# Patient Record
Sex: Male | Born: 1960
Health system: Southern US, Community
[De-identification: ages and names within clinical notes are randomized; demographics above are authoritative.]

## PROBLEM LIST (undated history)

## (undated) DIAGNOSIS — I10 Essential (primary) hypertension: Secondary | ICD-10-CM

## (undated) DIAGNOSIS — K219 Gastro-esophageal reflux disease without esophagitis: Secondary | ICD-10-CM

## (undated) DIAGNOSIS — G4730 Sleep apnea, unspecified: Secondary | ICD-10-CM

## (undated) HISTORY — DX: Sleep apnea, unspecified: G47.30

## (undated) HISTORY — PX: ESOPHAGOGASTRODUODENOSCOPY: SHX1529

## (undated) HISTORY — PX: UMBILICAL HERNIA REPAIR: SHX196

## (undated) HISTORY — PX: CHOLECYSTECTOMY: SHX55

---

## 2001-12-30 ENCOUNTER — Ambulatory Visit (HOSPITAL_BASED_OUTPATIENT_CLINIC_OR_DEPARTMENT_OTHER): Admission: RE | Admit: 2001-12-30 | Discharge: 2001-12-30 | Payer: Self-pay | Admitting: Internal Medicine

## 2004-05-20 ENCOUNTER — Emergency Department (HOSPITAL_COMMUNITY): Admission: EM | Admit: 2004-05-20 | Discharge: 2004-05-20 | Payer: Self-pay | Admitting: Emergency Medicine

## 2004-11-05 ENCOUNTER — Emergency Department (HOSPITAL_COMMUNITY): Admission: EM | Admit: 2004-11-05 | Discharge: 2004-11-05 | Payer: Self-pay | Admitting: Emergency Medicine

## 2007-03-23 ENCOUNTER — Emergency Department (HOSPITAL_COMMUNITY): Admission: EM | Admit: 2007-03-23 | Discharge: 2007-03-23 | Payer: Self-pay | Admitting: Emergency Medicine

## 2014-10-19 ENCOUNTER — Encounter (HOSPITAL_COMMUNITY): Payer: Self-pay | Admitting: Emergency Medicine

## 2014-10-19 ENCOUNTER — Emergency Department (HOSPITAL_COMMUNITY): Payer: BLUE CROSS/BLUE SHIELD

## 2014-10-19 ENCOUNTER — Emergency Department (HOSPITAL_COMMUNITY)
Admission: EM | Admit: 2014-10-19 | Discharge: 2014-10-19 | Disposition: A | Discharge status: Home / Self Care | Payer: BLUE CROSS/BLUE SHIELD | Attending: Emergency Medicine | Admitting: Emergency Medicine

## 2014-10-19 DIAGNOSIS — I10 Essential (primary) hypertension: Secondary | ICD-10-CM | POA: Insufficient documentation

## 2014-10-19 DIAGNOSIS — R079 Chest pain, unspecified: Secondary | ICD-10-CM

## 2014-10-19 DIAGNOSIS — R0789 Other chest pain: Secondary | ICD-10-CM | POA: Insufficient documentation

## 2014-10-19 DIAGNOSIS — K219 Gastro-esophageal reflux disease without esophagitis: Secondary | ICD-10-CM | POA: Diagnosis not present

## 2014-10-19 DIAGNOSIS — R1013 Epigastric pain: Secondary | ICD-10-CM | POA: Diagnosis present

## 2014-10-19 DIAGNOSIS — Z79899 Other long term (current) drug therapy: Secondary | ICD-10-CM | POA: Diagnosis not present

## 2014-10-19 DIAGNOSIS — R109 Unspecified abdominal pain: Secondary | ICD-10-CM

## 2014-10-19 HISTORY — DX: Essential (primary) hypertension: I10

## 2014-10-19 HISTORY — DX: Gastro-esophageal reflux disease without esophagitis: K21.9

## 2014-10-19 LAB — LIPASE, BLOOD: Lipase: 24 U/L (ref 22–51)

## 2014-10-19 LAB — CBC WITH DIFFERENTIAL/PLATELET
BASOS ABS: 0 10*3/uL (ref 0.0–0.1)
BASOS PCT: 0 % (ref 0–1)
Eosinophils Absolute: 0 10*3/uL (ref 0.0–0.7)
Eosinophils Relative: 0 % (ref 0–5)
HEMATOCRIT: 41.1 % (ref 39.0–52.0)
HEMOGLOBIN: 14.2 g/dL (ref 13.0–17.0)
LYMPHS PCT: 12 % (ref 12–46)
Lymphs Abs: 1.8 10*3/uL (ref 0.7–4.0)
MCH: 28.6 pg (ref 26.0–34.0)
MCHC: 34.5 g/dL (ref 30.0–36.0)
MCV: 82.7 fL (ref 78.0–100.0)
Monocytes Absolute: 0.7 10*3/uL (ref 0.1–1.0)
Monocytes Relative: 5 % (ref 3–12)
NEUTROS ABS: 12.2 10*3/uL — AB (ref 1.7–7.7)
NEUTROS PCT: 83 % — AB (ref 43–77)
Platelets: 230 10*3/uL (ref 150–400)
RBC: 4.97 MIL/uL (ref 4.22–5.81)
RDW: 12.7 % (ref 11.5–15.5)
WBC: 14.7 10*3/uL — AB (ref 4.0–10.5)

## 2014-10-19 LAB — COMPREHENSIVE METABOLIC PANEL
ALT: 32 U/L (ref 17–63)
AST: 25 U/L (ref 15–41)
Albumin: 4.2 g/dL (ref 3.5–5.0)
Alkaline Phosphatase: 88 U/L (ref 38–126)
Anion gap: 10 (ref 5–15)
BUN: 15 mg/dL (ref 6–20)
CALCIUM: 9 mg/dL (ref 8.9–10.3)
CO2: 25 mmol/L (ref 22–32)
CREATININE: 0.85 mg/dL (ref 0.61–1.24)
Chloride: 101 mmol/L (ref 101–111)
GFR calc non Af Amer: >60 mL/min (ref >60)
GLUCOSE: 114 mg/dL — AB (ref 65–99)
Potassium: 3.3 mmol/L — ABNORMAL LOW (ref 3.5–5.1)
SODIUM: 136 mmol/L (ref 135–145)
TOTAL PROTEIN: 7.4 g/dL (ref 6.5–8.1)
Total Bilirubin: 0.5 mg/dL (ref 0.3–1.2)

## 2014-10-19 LAB — TROPONIN I: Troponin I: <0.03 ng/mL (ref <0.031)

## 2014-10-19 LAB — D-DIMER, QUANTITATIVE: D-Dimer, Quant: <0.27 ug/mL-FEU (ref 0.00–0.48)

## 2014-10-19 MED ORDER — IOHEXOL 300 MG/ML  SOLN
100.0000 mL | Freq: Once | INTRAMUSCULAR | Status: AC | PRN
  Administered 2014-10-19: 100 mL via INTRAVENOUS

## 2014-10-19 MED ORDER — GI COCKTAIL ~~LOC~~
ORAL | Status: AC
  Filled 2014-10-19: qty 30

## 2014-10-19 MED ORDER — HYDROCODONE-ACETAMINOPHEN 5-325 MG PO TABS: 2.0000 | ORAL_TABLET | ORAL | Status: DC | PRN

## 2014-10-19 MED ORDER — IOHEXOL 300 MG/ML  SOLN
25.0000 mL | Freq: Once | INTRAMUSCULAR | Status: AC | PRN
  Administered 2014-10-19: 25 mL via ORAL

## 2014-10-19 MED ORDER — FAMOTIDINE IN NACL 20-0.9 MG/50ML-% IV SOLN
20.0000 mg | INTRAVENOUS | Status: AC
  Administered 2014-10-19: 20 mg via INTRAVENOUS
  Filled 2014-10-19: qty 50

## 2014-10-19 MED ORDER — GI COCKTAIL ~~LOC~~
30.0000 mL | Freq: Once | ORAL | Status: DC
Start: 2014-10-19 — End: 2014-10-19

## 2014-10-19 MED ORDER — RANITIDINE HCL 150 MG PO CAPS: 150.0000 mg | ORAL_CAPSULE | Freq: Two times a day (BID) | ORAL | Status: DC

## 2014-10-19 MED ORDER — PANTOPRAZOLE SODIUM 40 MG IV SOLR
40.0000 mg | INTRAVENOUS | Status: AC
  Administered 2014-10-19: 40 mg via INTRAVENOUS
  Filled 2014-10-19: qty 40

## 2014-10-19 MED ORDER — GI COCKTAIL ~~LOC~~
30.0000 mL | Freq: Once | ORAL | Status: AC
  Administered 2014-10-19: 30 mL via ORAL

## 2014-10-19 NOTE — Discharge Instructions (Signed)
Your testing has shown an elevated blood count your other tests and your CT scan were normal -   Read attached reading instructions about peptic ulcer disease.  Please call your doctor for a followup appointment within 24-48 hours. When you talk to your doctor please let them know that you were seen in the emergency department and have them acquire all of your records so that they can discuss the findings with you and formulate a treatment plan to fully care for your new and ongoing problems.

## 2014-10-19 NOTE — ED Notes (Signed)
PT c/o epigastric pain that radiates to his back starting this am at 0700. PT also c/o SOB on exertion today.

## 2014-10-19 NOTE — ED Notes (Signed)
Pt transported to CT ?

## 2014-10-19 NOTE — ED Provider Notes (Signed)
CSN: [id number]     Arrival date & time 10/19/14  1448 History   First MD Initiated Contact with Patient 10/19/14 1501     Chief Complaint  Patient presents with  . Abdominal Pain     (Consider location/radiation/quality/duration/timing/severity/associated sxs/prior Treatment) HPI Comments: The pt is a 54 y/o male, hx of htn, within 1 hour of eating breakfast consisting of 2 sausage biscuits - he developed acute onset of pain in the back and the epigastrium - this has been constant, seems to be positional slightly worse with laying back - not associated with cough, fevers, vomiting, diarrhea, swelling or headaches.  He has no hx of heart disease, lung disease or vascular disease.  He feels that this is somewhat similar to his GERD in the past except for the back pain which is new.  No meds pta.  Patient is a 54 y.o. male presenting with abdominal pain. The history is provided by the patient.  Abdominal Pain   Past Medical History  Diagnosis Date  . Hypertension   . GERD (gastroesophageal reflux disease)    History reviewed. No pertinent past surgical history. History reviewed. No pertinent family history. History  Substance Use Topics  . Smoking status: Never Smoker   . Smokeless tobacco: Not on file  . Alcohol Use: No    Review of Systems  Gastrointestinal: Positive for abdominal pain.  All other systems reviewed and are negative.     Allergies  Review of patient's allergies indicates no known allergies.  Home Medications   Prior to Admission medications   Medication Sig Start Date End Date Taking? Authorizing Provider  amLODipine (NORVASC) 10 MG tablet Take 10 mg by mouth daily. 07/24/14  Yes Historical Provider, MD  lisinopril-hydrochlorothiazide (PRINZIDE,ZESTORETIC) 20-12.5 MG per tablet Take 2 tablets by mouth daily. 10/01/14  Yes Historical Provider, MD  zolpidem (AMBIEN) 10 MG tablet Take 10 mg by mouth at bedtime. 08/05/14  Yes Historical Provider, MD   HYDROcodone-acetaminophen (NORCO/VICODIN) 5-325 MG per tablet Take 2 tablets by mouth every 4 (four) hours as needed. 10/19/14   Eber HongBrian Jannatul Wojdyla, MD  ranitidine (ZANTAC) 150 MG capsule Take 1 capsule (150 mg total) by mouth 2 (two) times daily. 10/19/14   Eber HongBrian Tavi Hoogendoorn, MD   BP 145/85 mmHg  Pulse 103  Temp(Src) 97.8 F (36.6 C) (Oral)  Resp 20  Ht 5\' 4"  (1.626 m)  Wt 260 lb (117.935 kg)  BMI 44.61 kg/m2  SpO2 100% Physical Exam  Constitutional: He appears well-developed and well-nourished. No distress.  HENT:  Normocephalic, atraumatic, clear oropharynx without any swelling, exudate, asymmetry, hypertrophy or erythema.  Moist mucous membranes, clear nasal passages  Eyes:  Bilateral conjunctiva are clear, pupils are equal round and reactive to light, there is no asymmetry of the pupils. There is no conjunctival discharge, there is no scleral icterus, the extraocular movements are normal. There is no periorbital swelling or edema or erythema.  Neck:  Very supple neck, no lymphadenopathy, no stiffness, no meningismus. No thyromegaly, trachea is midline. There is no torticollis  Cardiovascular:  The patient has normal heart sounds, no murmurs rubs or gallops,  t mildachycardia, strong pulses at the radial arteries bilaterally, no JVD, normal capillary refill time less than 2 seconds.  Pulmonary/Chest:  Lungs sounds are clear to auscultation bilaterally, no wheezes rhonchi or rales, no increased work of breathing, speaks in full sentences, no cyanosis, no accessory muscle use.  Abdominal:  The abdomen is soft and minimally tender in the epigastrium, no other  abdominal tenderness, there is no tenderness in the right lower quadrant, right upper quadrant, there is no CVA tenderness, there is no guarding, no masses. There is normal bowel sounds. There are no peritoneal signs, the abdomen is nonsurgical  Musculoskeletal:  Bilateral arms and legs are supple without deformity or tenderness. Normal range  of motion of all the major joints, no asymmetry of the lower extremities, no edema  Neurological:  No truncal or limb ataxia, normal gait   Skin: He is not diaphoretic.    ED Course  Procedures (including critical care time) Labs Review Labs Reviewed  CBC WITH DIFFERENTIAL/PLATELET - Abnormal; Notable for the following:    WBC 14.7 (*)    Neutrophils Relative % 83 (*)    Neutro Abs 12.2 (*)    All other components within normal limits  COMPREHENSIVE METABOLIC PANEL - Abnormal; Notable for the following:    Potassium 3.3 (*)    Glucose, Bld 114 (*)    All other components within normal limits  TROPONIN I  LIPASE, BLOOD  D-DIMER, QUANTITATIVE    Imaging Review Dg Chest 2 View  10/19/2014   CLINICAL DATA:  Chest pain and epigastric pain and shortness of breath on exertion.  EXAM: CHEST  2 VIEW  COMPARISON:  11/05/2004  FINDINGS: The heart size and mediastinal contours are within normal limits. Both lungs are clear. The visualized skeletal structures are unremarkable.  IMPRESSION: Normal chest.   Electronically Signed   By: Francene Boyers M.D.   On: 10/19/2014 15:48   Ct Abdomen Pelvis W Contrast  10/19/2014   CLINICAL DATA:  Abdominal and epigastric pain radiating around to back beginning at 0700 hours today, history hypertension, GERD  EXAM: CT ABDOMEN AND PELVIS WITH CONTRAST  TECHNIQUE: Multidetector CT imaging of the abdomen and pelvis was performed using the standard protocol following bolus administration of intravenous contrast. Sagittal and coronal MPR images reconstructed from axial data set.  CONTRAST:  25mL OMNIPAQUE IOHEXOL 300 MG/ML SOLN P.O., OMNIPAQUE IOHEXOL 300 MG/ML SOLN IV  COMPARISON:  None ; prior exam on the time line from 04/21/2011 has no images for comparison  FINDINGS: Lung bases clear.  Faintly visualized gallstones in gallbladder.  Liver, spleen, pancreas, kidneys, and adrenal glands normal.  Umbilical hernia containing fat.  Normal appendix.  Unremarkable  bladder and ureters.  Sigmoid diverticulosis without evidence of diverticulitis.  Stomach and bowel loops otherwise normal appearance.  Question small LEFT inguinal hernia containing fat.  No mass, adenopathy, free air, free fluid, or inflammatory process.  No acute osseous findings.  IMPRESSION: Cholelithiasis.  Umbilical and questionable LEFT inguinal hernias containing fat.  Sigmoid diverticulosis.   Electronically Signed   By: Ulyses Southward M.D.   On: 10/19/2014 16:57     EKG Interpretation   Date/Time:  Tuesday Oct 19 2014 15:07:25 EDT Ventricular Rate:  99 PR Interval:  157 QRS Duration: 97 QT Interval:  344 QTC Calculation: 441 R Axis:   133 Text Interpretation:  Sinus or ectopic atrial rhythm Probable left atrial  enlargement Right axis deviation Abnormal lateral Q waves No old tracing  to compare Confirmed by Tremont Gavitt  MD, Arville Postlewaite ([id number]) on 10/19/2014 3:34:16 PM      MDM   Final diagnoses:  Chest pain  Abdominal pain  Abdominal pain    Overall the patient is well-appearing, his vital signs are significant only for mild hypertension, he has minimally reproducible symptoms on palpation raising concern for other sources of the patient's pain, consider  aneurysm, dissection, cardiac source, pulmonary embolism. Labs ordered, GI cocktail.  Labs with WBC, other labs neg including d dimer - he hqad improvement with pepcid and protonix, not much help with GI cocktail - CT ordered showing no acute findings - pt informed of all results and will need close f/u.   Pt in agreement with plan.  Meds given in ED:  Medications  gi cocktail (Maalox,Lidocaine,Donnatal) (30 mLs Oral Given 10/19/14 1514)  famotidine (PEPCID) IVPB 20 mg premix (0 mg Intravenous Stopped 10/19/14 1728)  pantoprazole (PROTONIX) injection 40 mg (40 mg Intravenous Given 10/19/14 1651)  iohexol (OMNIPAQUE) 300 MG/ML solution 25 mL (25 mLs Oral Contrast Given 10/19/14 1635)  iohexol (OMNIPAQUE) 300 MG/ML solution 100 mL (100  mLs Intravenous Contrast Given 10/19/14 1635)    Discharge Medication List as of 10/19/2014  5:24 PM    START taking these medications   Details  HYDROcodone-acetaminophen (NORCO/VICODIN) 5-325 MG per tablet Take 2 tablets by mouth every 4 (four) hours as needed., Starting 10/19/2014, Until Discontinued, Print    ranitidine (ZANTAC) 150 MG capsule Take 1 capsule (150 mg total) by mouth 2 (two) times daily., Starting 10/19/2014, Until Discontinued, Print            Eber HongBrian Gunter Conde, MD 10/20/14 [phone]

## 2014-10-19 NOTE — ED Notes (Signed)
Duplicate order for GI cocktail, second order discontinued.

## 2015-06-28 ENCOUNTER — Emergency Department (HOSPITAL_COMMUNITY): Payer: BLUE CROSS/BLUE SHIELD

## 2015-06-28 ENCOUNTER — Emergency Department (HOSPITAL_COMMUNITY)
Admission: EM | Admit: 2015-06-28 | Discharge: 2015-06-28 | Disposition: A | Discharge status: Home / Self Care | Payer: BLUE CROSS/BLUE SHIELD | Attending: Emergency Medicine | Admitting: Emergency Medicine

## 2015-06-28 ENCOUNTER — Encounter (HOSPITAL_COMMUNITY): Payer: Self-pay | Admitting: *Deleted

## 2015-06-28 DIAGNOSIS — K219 Gastro-esophageal reflux disease without esophagitis: Secondary | ICD-10-CM | POA: Diagnosis not present

## 2015-06-28 DIAGNOSIS — Z79899 Other long term (current) drug therapy: Secondary | ICD-10-CM | POA: Diagnosis not present

## 2015-06-28 DIAGNOSIS — I10 Essential (primary) hypertension: Secondary | ICD-10-CM | POA: Insufficient documentation

## 2015-06-28 DIAGNOSIS — M19071 Primary osteoarthritis, right ankle and foot: Secondary | ICD-10-CM | POA: Diagnosis not present

## 2015-06-28 DIAGNOSIS — M79671 Pain in right foot: Secondary | ICD-10-CM | POA: Diagnosis present

## 2015-06-28 MED ORDER — TRAMADOL HCL 50 MG PO TABS: 50.0000 mg | ORAL_TABLET | Freq: Four times a day (QID) | ORAL | Status: DC | PRN

## 2015-06-28 MED ORDER — INDOMETHACIN 25 MG PO CAPS
25.0000 mg | ORAL_CAPSULE | Freq: Once | ORAL | Status: AC
  Administered 2015-06-28: 25 mg via ORAL
  Filled 2015-06-28: qty 1

## 2015-06-28 MED ORDER — PREDNISONE 50 MG PO TABS
60.0000 mg | ORAL_TABLET | Freq: Once | ORAL | Status: AC
  Administered 2015-06-28: 60 mg via ORAL
  Filled 2015-06-28: qty 1

## 2015-06-28 MED ORDER — DEXAMETHASONE 4 MG PO TABS: 4.0000 mg | ORAL_TABLET | Freq: Two times a day (BID) | ORAL | Status: DC

## 2015-06-28 MED ORDER — ONDANSETRON HCL 4 MG PO TABS
4.0000 mg | ORAL_TABLET | Freq: Once | ORAL | Status: AC
  Administered 2015-06-28: 4 mg via ORAL
  Filled 2015-06-28: qty 1

## 2015-06-28 MED ORDER — INDOMETHACIN 25 MG PO CAPS: 25.0000 mg | ORAL_CAPSULE | Freq: Three times a day (TID) | ORAL | Status: DC | PRN

## 2015-06-28 NOTE — Discharge Instructions (Signed)
Please see your primary physician, or Dr. Luiz Blare (orthopedic specialist) for additional evaluation and management of your foot pain. Please use the postoperative shoe until you can safely and comfortably you should've regular shoe. Please take the Indocin and the Decadron and the Ultram with food. Ultram may cause drowsiness, please use this medication with caution. Osteoarthritis Osteoarthritis is a disease that causes soreness and inflammation of a joint. It occurs when the cartilage at the affected joint wears down. Cartilage acts as a cushion, covering the ends of bones where they meet to form a joint. Osteoarthritis is the most common form of arthritis. It often occurs in older people. The joints affected most often by this condition include those in the:  Ends of the fingers.  Thumbs.  Neck.  Lower back.  Knees.  Hips. CAUSES  Over time, the cartilage that covers the ends of bones begins to wear away. This causes bone to rub on bone, producing pain and stiffness in the affected joints.  RISK FACTORS Certain factors can increase your chances of having osteoarthritis, including:  Older age.  Excessive body weight.  Overuse of joints.  Previous joint injury. SIGNS AND SYMPTOMS   Pain, swelling, and stiffness in the joint.  Over time, the joint may lose its normal shape.  Small deposits of bone (osteophytes) may grow on the edges of the joint.  Bits of bone or cartilage can break off and float inside the joint space. This may cause more pain and damage. DIAGNOSIS  Your health care provider will do a physical exam and ask about your symptoms. Various tests may be ordered, such as:  X-rays of the affected joint.  Blood tests to rule out other types of arthritis. Additional tests may be used to diagnose your condition. TREATMENT  Goals of treatment are to control pain and improve joint function. Treatment plans may include:  A prescribed exercise program that allows for  rest and joint relief.  A weight control plan.  Pain relief techniques, such as:  Properly applied heat and cold.  Electric pulses delivered to nerve endings under the skin (transcutaneous electrical nerve stimulation [TENS]).  Massage.  Certain nutritional supplements.  Medicines to control pain, such as:  Acetaminophen.  Nonsteroidal anti-inflammatory drugs (NSAIDs), such as naproxen.  Narcotic or central-acting agents, such as tramadol.  Corticosteroids. These can be given orally or as an injection.  Surgery to reposition the bones and relieve pain (osteotomy) or to remove loose pieces of bone and cartilage. Joint replacement may be needed in advanced states of osteoarthritis. HOME CARE INSTRUCTIONS   Take medicines only as directed by your health care provider.  Maintain a healthy weight. Follow your health care provider's instructions for weight control. This may include dietary instructions.  Exercise as directed. Your health care provider can recommend specific types of exercise. These may include:  Strengthening exercises. These are done to strengthen the muscles that support joints affected by arthritis. They can be performed with weights or with exercise bands to add resistance.  Aerobic activities. These are exercises, such as brisk walking or low-impact aerobics, that get your heart pumping.  Range-of-motion activities. These keep your joints limber.  Balance and agility exercises. These help you maintain daily living skills.  Rest your affected joints as directed by your health care provider.  Keep all follow-up visits as directed by your health care provider. SEEK MEDICAL CARE IF:   Your skin turns red.  You develop a rash in addition to your joint pain.  You have worsening joint pain.  You have a fever along with joint or muscle aches. SEEK IMMEDIATE MEDICAL CARE IF:  You have a significant loss of weight or appetite.  You have night  sweats. FOR MORE INFORMATION   National Institute of Arthritis and Musculoskeletal and Skin Diseases: www.niams.http://www.myers.net/  [employer] on Aging: https://walker.com/  American College of Rheumatology: www.rheumatology.org   This information is not intended to replace advice given to you by your health care provider. Make sure you discuss any questions you have with your health care provider.   Document Released: 05/21/2005 Document Revised: 06/11/2014 Document Reviewed: 01/26/2013 Elsevier Interactive Patient Education [employer]

## 2015-06-28 NOTE — ED Notes (Signed)
PA at bedside.

## 2015-06-28 NOTE — ED Provider Notes (Signed)
CSN: [id number]     Arrival date & time 06/28/15  1053 History   First MD Initiated Contact with Patient 06/28/15 1135     Chief Complaint  Patient presents with  . Foot Pain     (Consider location/radiation/quality/duration/timing/severity/associated sxs/prior Treatment) HPI Comments: Patient states that he curled his toes stooping down on Sunday, January 22. He had very minimal discomfort. On yesterday however at work he had more severe pain, states he could not stand wears boots.  Patient is a 55 y.o. male presenting with lower extremity pain. The history is provided by the patient.  Foot Pain This is a new problem. The current episode started in the past 7 days. The problem occurs intermittently. The problem has been gradually worsening. Associated symptoms include arthralgias and joint swelling. Pertinent negatives include no chills or fever. The symptoms are aggravated by standing and walking. He has tried acetaminophen for the symptoms. The treatment provided no relief.    Past Medical History  Diagnosis Date  . Hypertension   . GERD (gastroesophageal reflux disease)    History reviewed. No pertinent past surgical history. History reviewed. No pertinent family history. Social History  Substance Use Topics  . Smoking status: Never Smoker   . Smokeless tobacco: None  . Alcohol Use: No    Review of Systems  Constitutional: Negative for fever and chills.  Musculoskeletal: Positive for joint swelling and arthralgias.  All other systems reviewed and are negative.     Allergies  Review of patient's allergies indicates no known allergies.  Home Medications   Prior to Admission medications   Medication Sig Start Date End Date Taking? Authorizing Provider  acetaminophen (TYLENOL) 500 MG tablet Take 1,000 mg by mouth every 6 (six) hours as needed for moderate pain.   Yes Historical Provider, MD  ALPRAZolam Prudy Feeler) 0.5 MG tablet Take 0.25 mg by mouth daily as needed for  anxiety.   Yes Historical Provider, MD  amLODipine (NORVASC) 10 MG tablet Take 10 mg by mouth every evening.  07/24/14  Yes Historical Provider, MD  lisinopril-hydrochlorothiazide (PRINZIDE,ZESTORETIC) 20-12.5 MG per tablet Take 2 tablets by mouth every evening.  10/01/14  Yes Historical Provider, MD  ranitidine (ZANTAC) 150 MG capsule Take 1 capsule (150 mg total) by mouth 2 (two) times daily. Patient taking differently: Take 150 mg by mouth daily.  10/19/14  Yes Eber Hong, MD  zolpidem (AMBIEN) 10 MG tablet Take 10 mg by mouth at bedtime. 08/05/14  Yes Historical Provider, MD   BP 138/85 mmHg  Pulse 97  Temp(Src) 98 F (36.7 C) (Oral)  Resp 16  Ht 5' 4.5" (1.638 m)  Wt 117.935 kg  BMI 43.96 kg/m2  SpO2 98% Physical Exam  Constitutional: He is oriented to person, place, and time. He appears well-developed and well-nourished.  Non-toxic appearance.  HENT:  Head: Normocephalic.  Right Ear: Tympanic membrane and external ear normal.  Left Ear: Tympanic membrane and external ear normal.  Eyes: EOM and lids are normal. Pupils are equal, round, and reactive to light.  Neck: Normal range of motion. Neck supple. Carotid bruit is not present.  Cardiovascular: Normal rate, regular rhythm, normal heart sounds, intact distal pulses and normal pulses.   Pulmonary/Chest: Breath sounds normal. No respiratory distress.  Abdominal: Soft. Bowel sounds are normal. There is no tenderness. There is no guarding.  Musculoskeletal: Normal range of motion.  There is pain, swelling, and some redness of the first MP joint of the right foot. There no lesions between the  toes. There is no noted puncture wound of the plantar surface of the right foot. Dorsalis pedis is 2+, posterior tibial pulses 2+. Capillary refill is less than 2 seconds.  Lymphadenopathy:       Head (right side): No submandibular adenopathy present.       Head (left side): No submandibular adenopathy present.    He has no cervical adenopathy.   Neurological: He is alert and oriented to person, place, and time. He has normal strength. No cranial nerve deficit or sensory deficit.  Skin: Skin is warm and dry.  Psychiatric: He has a normal mood and affect. His speech is normal.  Nursing note and vitals reviewed.   ED Course  Procedures (including critical care time) Labs Review Labs Reviewed - No data to display  Imaging Review No results found. I have personally reviewed and evaluated these images and lab results as part of my medical decision-making.   EKG Interpretation None      MDM  X-ray of the right foot is negative for fracture or dislocation or foreign body. There is suggestion of arthritic changes present, even with some spurring present. The patient was made aware of these x-ray findings and exam findings. I've asked him to see his primary physician, or Dr. Luiz Blare (orthopedics) for additional evaluation and management of this issue. Patient will be placed on Indocin, Decadron, and Ultram for assistance with his discomfort. The patient is fitted with a postoperative shoe to use until he can safely wear his current shoes.    Final diagnoses:  None    *I have reviewed nursing notes, vital signs, and all appropriate lab and imaging results for this patient.[address] PA-C 06/28/15 1244  Bethann Berkshire, MD 06/28/15 [phone]

## 2015-06-28 NOTE — ED Notes (Signed)
Pt reports being squatted down Sunday and felt a "pop" in the top of his right foot. Denies pain at that time, but has pain has started progressing since yesterday, painful to bear weight.

## 2015-09-30 DIAGNOSIS — Z0000 Encounter for general adult medical examination without abnormal findings: Secondary | ICD-10-CM | POA: Diagnosis not present

## 2015-10-14 DIAGNOSIS — I10 Essential (primary) hypertension: Secondary | ICD-10-CM | POA: Diagnosis not present

## 2015-10-14 DIAGNOSIS — J301 Allergic rhinitis due to pollen: Secondary | ICD-10-CM | POA: Diagnosis not present

## 2015-10-14 DIAGNOSIS — K219 Gastro-esophageal reflux disease without esophagitis: Secondary | ICD-10-CM | POA: Diagnosis not present

## 2015-10-14 DIAGNOSIS — Z0001 Encounter for general adult medical examination with abnormal findings: Secondary | ICD-10-CM | POA: Diagnosis not present

## 2015-10-14 DIAGNOSIS — E6609 Other obesity due to excess calories: Secondary | ICD-10-CM | POA: Diagnosis not present

## 2015-10-14 DIAGNOSIS — Z1212 Encounter for screening for malignant neoplasm of rectum: Secondary | ICD-10-CM | POA: Diagnosis not present

## 2015-11-23 ENCOUNTER — Emergency Department (HOSPITAL_COMMUNITY)
Admission: EM | Admit: 2015-11-23 | Discharge: 2015-11-23 | Disposition: A | Discharge status: Home / Self Care | Payer: BLUE CROSS/BLUE SHIELD | Attending: Emergency Medicine | Admitting: Emergency Medicine

## 2015-11-23 ENCOUNTER — Encounter (HOSPITAL_COMMUNITY): Payer: Self-pay | Admitting: Emergency Medicine

## 2015-11-23 ENCOUNTER — Emergency Department (HOSPITAL_COMMUNITY): Payer: BLUE CROSS/BLUE SHIELD

## 2015-11-23 DIAGNOSIS — I10 Essential (primary) hypertension: Secondary | ICD-10-CM | POA: Diagnosis not present

## 2015-11-23 DIAGNOSIS — R1011 Right upper quadrant pain: Secondary | ICD-10-CM | POA: Diagnosis not present

## 2015-11-23 DIAGNOSIS — R1010 Upper abdominal pain, unspecified: Secondary | ICD-10-CM

## 2015-11-23 DIAGNOSIS — Z79899 Other long term (current) drug therapy: Secondary | ICD-10-CM | POA: Insufficient documentation

## 2015-11-23 DIAGNOSIS — K8020 Calculus of gallbladder without cholecystitis without obstruction: Secondary | ICD-10-CM | POA: Diagnosis not present

## 2015-11-23 DIAGNOSIS — R1013 Epigastric pain: Secondary | ICD-10-CM | POA: Diagnosis not present

## 2015-11-23 DIAGNOSIS — R109 Unspecified abdominal pain: Secondary | ICD-10-CM

## 2015-11-23 LAB — COMPREHENSIVE METABOLIC PANEL
ALBUMIN: 4.1 g/dL (ref 3.5–5.0)
ALK PHOS: 79 U/L (ref 38–126)
ALT: 30 U/L (ref 17–63)
ANION GAP: 8 (ref 5–15)
AST: 26 U/L (ref 15–41)
BILIRUBIN TOTAL: 0.5 mg/dL (ref 0.3–1.2)
BUN: 18 mg/dL (ref 6–20)
CALCIUM: 8.7 mg/dL — AB (ref 8.9–10.3)
CO2: 28 mmol/L (ref 22–32)
CREATININE: 0.75 mg/dL (ref 0.61–1.24)
Chloride: 99 mmol/L — ABNORMAL LOW (ref 101–111)
GFR calc non Af Amer: >60 mL/min (ref >60)
GLUCOSE: 159 mg/dL — AB (ref 65–99)
Potassium: 2.9 mmol/L — ABNORMAL LOW (ref 3.5–5.1)
Sodium: 135 mmol/L (ref 135–145)
TOTAL PROTEIN: 7.1 g/dL (ref 6.5–8.1)

## 2015-11-23 LAB — CBC WITH DIFFERENTIAL/PLATELET
BASOS PCT: 0 %
Basophils Absolute: 0 10*3/uL (ref 0.0–0.1)
EOS ABS: 0.1 10*3/uL (ref 0.0–0.7)
EOS PCT: 1 %
HCT: 40.5 % (ref 39.0–52.0)
HEMOGLOBIN: 13.5 g/dL (ref 13.0–17.0)
Lymphocytes Relative: 25 %
Lymphs Abs: 2.1 10*3/uL (ref 0.7–4.0)
MCH: 27.6 pg (ref 26.0–34.0)
MCHC: 33.3 g/dL (ref 30.0–36.0)
MCV: 82.7 fL (ref 78.0–100.0)
MONO ABS: 0.7 10*3/uL (ref 0.1–1.0)
MONOS PCT: 9 %
NEUTROS PCT: 65 %
Neutro Abs: 5.6 10*3/uL (ref 1.7–7.7)
PLATELETS: 201 10*3/uL (ref 150–400)
RBC: 4.9 MIL/uL (ref 4.22–5.81)
RDW: 12.8 % (ref 11.5–15.5)
WBC: 8.6 10*3/uL (ref 4.0–10.5)

## 2015-11-23 LAB — LIPASE, BLOOD: Lipase: 19 U/L (ref 11–51)

## 2015-11-23 MED ORDER — POTASSIUM CHLORIDE CRYS ER 20 MEQ PO TBCR
40.0000 meq | EXTENDED_RELEASE_TABLET | Freq: Once | ORAL | Status: AC
  Administered 2015-11-23: 40 meq via ORAL
  Filled 2015-11-23: qty 2

## 2015-11-23 MED ORDER — LANSOPRAZOLE 30 MG PO CPDR: 30.0000 mg | DELAYED_RELEASE_CAPSULE | Freq: Every day | ORAL | Status: DC

## 2015-11-23 MED ORDER — MORPHINE SULFATE (PF) 4 MG/ML IV SOLN
4.0000 mg | Freq: Once | INTRAVENOUS | Status: AC
  Administered 2015-11-23: 4 mg via INTRAVENOUS
  Filled 2015-11-23: qty 1

## 2015-11-23 MED ORDER — POTASSIUM CHLORIDE 10 MEQ/100ML IV SOLN
10.0000 meq | INTRAVENOUS | Status: AC
  Administered 2015-11-23 (×2): 10 meq via INTRAVENOUS
  Filled 2015-11-23 (×2): qty 100

## 2015-11-23 MED ORDER — SODIUM CHLORIDE 0.9 % IV BOLUS (SEPSIS)
1000.0000 mL | Freq: Once | INTRAVENOUS | Status: AC
  Administered 2015-11-23: 1000 mL via INTRAVENOUS

## 2015-11-23 MED ORDER — ONDANSETRON 4 MG PO TBDP: ORAL_TABLET | ORAL | Status: DC

## 2015-11-23 MED ORDER — GI COCKTAIL ~~LOC~~
30.0000 mL | Freq: Once | ORAL | Status: AC
  Administered 2015-11-23: 30 mL via ORAL
  Filled 2015-11-23: qty 30

## 2015-11-23 MED ORDER — ONDANSETRON HCL 4 MG/2ML IJ SOLN
4.0000 mg | Freq: Once | INTRAMUSCULAR | Status: AC
  Administered 2015-11-23: 4 mg via INTRAVENOUS
  Filled 2015-11-23: qty 2

## 2015-11-23 MED ORDER — HYDROCODONE-ACETAMINOPHEN 5-325 MG PO TABS: 1.0000 | ORAL_TABLET | ORAL | Status: DC | PRN

## 2015-11-23 NOTE — ED Notes (Signed)
Patients family given something to drink at this time.

## 2015-11-23 NOTE — Discharge Instructions (Signed)
Abdominal Pain, Adult °Many things can cause abdominal pain. Usually, abdominal pain is not caused by a disease and will improve without treatment. It can often be observed and treated at home. Your health care provider will do a physical exam and possibly order blood tests and X-rays to help determine the seriousness of your pain. However, in many cases, more time must pass before a clear cause of the pain can be found. Before that point, your health care provider may not know if you need more testing or further treatment. °HOME CARE INSTRUCTIONS °Monitor your abdominal pain for any changes. The following actions may help to alleviate any discomfort you are experiencing: °· Only take over-the-counter or prescription medicines as directed by your health care provider. °· Do not take laxatives unless directed to do so by your health care provider. °· Try a clear liquid diet (broth, tea, or water) as directed by your health care provider. Slowly move to a bland diet as tolerated. °SEEK MEDICAL CARE IF: °· You have unexplained abdominal pain. °· You have abdominal pain associated with nausea or diarrhea. °· You have pain when you urinate or have a bowel movement. °· You experience abdominal pain that wakes you in the night. °· You have abdominal pain that is worsened or improved by eating food. °· You have abdominal pain that is worsened with eating fatty foods. °· You have a fever. °SEEK IMMEDIATE MEDICAL CARE IF: °· Your pain does not go away within 2 hours. °· You keep throwing up (vomiting). °· Your pain is felt only in portions of the abdomen, such as the right side or the left lower portion of the abdomen. °· You pass bloody or black tarry stools. °MAKE SURE YOU: °· Understand these instructions. °· Will watch your condition. °· Will get help right away if you are not doing well or get worse. °  °This information is not intended to replace advice given to you by your health care provider. Make sure you discuss  any questions you have with your health care provider. °  °Document Released: 02/28/2005 Document Revised: 02/09/2015 Document Reviewed: 01/28/2013 °Elsevier Interactive Patient Education ©2016 Elsevier Inc. ° °Cholelithiasis °Cholelithiasis (also called gallstones) is a form of gallbladder disease in which gallstones form in your gallbladder. The gallbladder is an organ that stores bile made in the liver, which helps digest fats. Gallstones begin as small crystals and slowly grow into stones. Gallstone pain occurs when the gallbladder spasms and a gallstone is blocking the duct. Pain can also occur when a stone passes out of the duct.  °RISK FACTORS °· Being male.   °· Having multiple pregnancies. Health care providers sometimes advise removing diseased gallbladders before future pregnancies.   °· Being obese. °· Eating a diet heavy in fried foods and fat.   °· Being older than 60 years and increasing age.   °· Prolonged use of medicines containing male hormones.   °· Having diabetes mellitus.   °· Rapidly losing weight.   °· Having a family history of gallstones (heredity).   °SYMPTOMS °· Nausea.   °· Vomiting. °· Abdominal pain.   °· Yellowing of the skin (jaundice).   °· Sudden pain. It may persist from several minutes to several hours. °· Fever.   °· Tenderness to the touch.  °In some cases, when gallstones do not move into the bile duct, people have no pain or symptoms. These are called "silent" gallstones.  °TREATMENT °Silent gallstones do not need treatment. In severe cases, emergency surgery may be required. Options for treatment include: °· Surgery to   remove the gallbladder. This is the most common treatment.  Medicines. These do not always work and may take 6-12 months or more to work.  Shock wave treatment (extracorporeal biliary lithotripsy). In this treatment an ultrasound machine sends shock waves to the gallbladder to break gallstones into smaller pieces that can pass into the intestines or  be dissolved by medicine. HOME CARE INSTRUCTIONS   Only take over-the-counter or prescription medicines for pain, discomfort, or fever as directed by your health care provider.   Follow a low-fat diet until seen again by your health care provider. Fat causes the gallbladder to contract, which can result in pain.   Follow up with your health care provider as directed. Attacks are almost always recurrent and surgery is usually required for permanent treatment.  SEEK IMMEDIATE MEDICAL CARE IF:   Your pain increases and is not controlled by medicines.   You have a fever or persistent symptoms for more than 2-3 days.   You have a fever and your symptoms suddenly get worse.   You have persistent nausea and vomiting.  MAKE SURE YOU:   Understand these instructions.  Will watch your condition.  Will get help right away if you are not doing well or get worse.   This information is not intended to replace advice given to you by your health care provider. Make sure you discuss any questions you have with your health care provider.   Document Released: 05/17/2005 Document Revised: 01/21/2013 Document Reviewed: 11/12/2012 Elsevier Interactive Patient Education 2016 Elsevier Inc. Gastritis, Adult Gastritis is soreness and swelling (inflammation) of the lining of the stomach. Gastritis can develop as a sudden onset (acute) or long-term (chronic) condition. If gastritis is not treated, it can lead to stomach bleeding and ulcers. CAUSES  Gastritis occurs when the stomach lining is weak or damaged. Digestive juices from the stomach then inflame the weakened stomach lining. The stomach lining may be weak or damaged due to viral or bacterial infections. One common bacterial infection is the Helicobacter pylori infection. Gastritis can also result from excessive alcohol consumption, taking certain medicines, or having too much acid in the stomach.  SYMPTOMS  In some cases, there are no symptoms.  When symptoms are present, they may include:  Pain or a burning sensation in the upper abdomen.  Nausea.  Vomiting.  An uncomfortable feeling of fullness after eating. DIAGNOSIS  Your caregiver may suspect you have gastritis based on your symptoms and a physical exam. To determine the cause of your gastritis, your caregiver may perform the following:  Blood or stool tests to check for the H pylori bacterium.  Gastroscopy. A thin, flexible tube (endoscope) is passed down the esophagus and into the stomach. The endoscope has a light and camera on the end. Your caregiver uses the endoscope to view the inside of the stomach.  Taking a tissue sample (biopsy) from the stomach to examine under a microscope. TREATMENT  Depending on the cause of your gastritis, medicines may be prescribed. If you have a bacterial infection, such as an H pylori infection, antibiotics may be given. If your gastritis is caused by too much acid in the stomach, H2 blockers or antacids may be given. Your caregiver may recommend that you stop taking aspirin, ibuprofen, or other nonsteroidal anti-inflammatory drugs (NSAIDs). HOME CARE INSTRUCTIONS  Only take over-the-counter or prescription medicines as directed by your caregiver.  If you were given antibiotic medicines, take them as directed. Finish them even if you start to feel better.  Drink enough fluids to keep your urine clear or pale yellow.  Avoid foods and drinks that make your symptoms worse, such as:  Caffeine or alcoholic drinks.  Chocolate.  Peppermint or mint flavorings.  Garlic and onions.  Spicy foods.  Citrus fruits, such as oranges, lemons, or limes.  Tomato-based foods such as sauce, chili, salsa, and pizza.  Fried and fatty foods.  Eat small, frequent meals instead of large meals. SEEK IMMEDIATE MEDICAL CARE IF:   You have black or dark red stools.  You vomit blood or material that looks like coffee grounds.  You are unable to  keep fluids down.  Your abdominal pain gets worse.  You have a fever.  You do not feel better after 1 week.  You have any other questions or concerns. MAKE SURE YOU:  Understand these instructions.  Will watch your condition.  Will get help right away if you are not doing well or get worse.   This information is not intended to replace advice given to you by your health care provider. Make sure you discuss any questions you have with your health care provider.   Document Released: 05/15/2001 Document Revised: 11/20/2011 Document Reviewed: 07/04/2011 Elsevier Interactive Patient Education [employer]2016 Elsevier Inc.

## 2015-11-23 NOTE — ED Notes (Signed)
Pt c/o abd pain and lower back pain. Pt denies any n/v/d.

## 2015-11-23 NOTE — ED Notes (Signed)
Pt left ED with no signs of distress, ambulatory. Pt verbalized discharge instructions.

## 2015-11-23 NOTE — ED Provider Notes (Signed)
CSN: [id number]     Arrival date & time 11/23/15  9562 History   First MD Initiated Contact with Patient 11/23/15 [phone]     Chief Complaint  Patient presents with  . Abdominal Pain     (Consider location/radiation/quality/duration/timing/severity/associated sxs/prior Treatment) HPI Patient presents with upper abdominal pain radiating to the right side of his back and right shoulder. This started at 2 AM. States he snacked around midnight. The pain is been constant since onset. He's had no nausea vomiting or diarrhea. Had a bowel movement yesterday which was hard. No blood in stool. No fever or chills. States he's had no previously similar symptoms. Denies urinary symptoms.  Past Medical History  Diagnosis Date  . Hypertension   . GERD (gastroesophageal reflux disease)    History reviewed. No pertinent past surgical history. History reviewed. No pertinent family history. Social History  Substance Use Topics  . Smoking status: Never Smoker   . Smokeless tobacco: None  . Alcohol Use: No    Review of Systems  Constitutional: Negative for fever and chills.  Respiratory: Negative for shortness of breath.   Cardiovascular: Negative for chest pain.  Gastrointestinal: Positive for abdominal pain. Negative for nausea, vomiting, diarrhea and blood in stool.  Genitourinary: Negative for dysuria, frequency, hematuria and flank pain.  Musculoskeletal: Positive for myalgias and back pain. Negative for neck pain and neck stiffness.  Skin: Negative for rash and wound.  Neurological: Negative for dizziness, weakness, light-headedness, numbness and headaches.  All other systems reviewed and are negative.     Allergies  Review of patient's allergies indicates no known allergies.  Home Medications   Prior to Admission medications   Medication Sig Start Date End Date Taking? Authorizing Provider  acetaminophen (TYLENOL) 500 MG tablet Take 1,000 mg by mouth every 6 (six) hours as needed for  moderate pain.   Yes Historical Provider, MD  ALPRAZolam Prudy Feeler) 0.5 MG tablet Take 0.25 mg by mouth daily as needed for anxiety.   Yes Historical Provider, MD  amLODipine (NORVASC) 10 MG tablet Take 10 mg by mouth every evening.  07/24/14  Yes Historical Provider, MD  dexamethasone (DECADRON) 4 MG tablet Take 1 tablet (4 mg total) by mouth 2 (two) times daily with a meal. 06/28/15  Yes Ivery Quale, PA-C  lisinopril-hydrochlorothiazide (PRINZIDE,ZESTORETIC) 20-12.5 MG per tablet Take 2 tablets by mouth every evening.  10/01/14  Yes Historical Provider, MD  Multiple Vitamin (MULTIVITAMIN WITH MINERALS) TABS tablet Take 1 tablet by mouth daily.   Yes Historical Provider, MD  ranitidine (ZANTAC) 150 MG capsule Take 1 capsule (150 mg total) by mouth 2 (two) times daily. Patient taking differently: Take 150 mg by mouth daily.  10/19/14  Yes Eber Hong, MD  zolpidem (AMBIEN) 10 MG tablet Take 10 mg by mouth at bedtime. 08/05/14  Yes Historical Provider, MD  HYDROcodone-acetaminophen (NORCO) 5-325 MG tablet Take 1-2 tablets by mouth every 4 (four) hours as needed for severe pain. 11/23/15   Loren Racer, MD  indomethacin (INDOCIN) 25 MG capsule Take 1 capsule (25 mg total) by mouth 3 (three) times daily as needed. Patient not taking: Reported on 11/23/2015 06/28/15   Ivery Quale, PA-C  lansoprazole (PREVACID) 30 MG capsule Take 1 capsule (30 mg total) by mouth daily at 12 noon. 11/23/15   Loren Racer, MD  ondansetron (ZOFRAN ODT) 4 MG disintegrating tablet  ODT q4 hours prn nausea/vomit 11/23/15   Loren Racer, MD  traMADol (ULTRAM) 50 MG tablet Take 1 tablet (50 mg total)  by mouth every 6 (six) hours as needed. Patient not taking: Reported on 11/23/2015 06/28/15   Ivery QualeHobson Bryant, PA-C   BP 127/73 mmHg  Pulse 83  Temp(Src) 98.1 F (36.7 C) (Oral)  Resp 18  Ht 5\' 4"  (1.626 m)  Wt 275 lb (124.739 kg)  BMI 47.18 kg/m2  SpO2 94% Physical Exam  Constitutional: He is oriented to person, place, and  time. He appears well-developed and well-nourished. No distress.  HENT:  Head: Normocephalic and atraumatic.  Mouth/Throat: Oropharynx is clear and moist.  Eyes: EOM are normal. Pupils are equal, round, and reactive to light.  Neck: Normal range of motion. Neck supple.  Cardiovascular: Normal rate and regular rhythm.  Exam reveals no gallop and no friction rub.   No murmur heard. Pulmonary/Chest: Effort normal and breath sounds normal. No respiratory distress. He has no wheezes. He has no rales. He exhibits no tenderness.  Abdominal: Soft. Bowel sounds are normal. He exhibits no distension and no mass. There is tenderness (patient with right upper quadrant and epigastric tenderness with palpation. No rebound or guarding.). There is no rebound and no guarding.  Musculoskeletal: Normal range of motion. He exhibits no edema or tenderness.  No CVA tenderness. No lower extremity swelling or asymmetry. Distal pulses are equal and intact.  Neurological: He is alert and oriented to person, place, and time.  Moves all extremities without deficit. Sensation is fully intact.  Skin: Skin is warm and dry. No rash noted. No erythema.  Psychiatric: He has a normal mood and affect. His behavior is normal.  Nursing note and vitals reviewed.   ED Course  Procedures (including critical care time) Labs Review Labs Reviewed  COMPREHENSIVE METABOLIC PANEL - Abnormal; Notable for the following:    Potassium 2.9 (*)    Chloride 99 (*)    Glucose, Bld 159 (*)    Calcium 8.7 (*)    All other components within normal limits  LIPASE, BLOOD  CBC WITH DIFFERENTIAL/PLATELET    Imaging Review [place] Abdomen Limited Ruq  11/23/2015  CLINICAL DATA:  10 hours of right upper quadrant pain EXAM: US ABDOMEN LIMITED - RIGHT UPPER QUADRANT COMPARISON:  Abdominal CT scan of Oct 19 2014 FINDINGS: Gallbladder: The gallbladder is adequately distended. There are multiple echogenic mobile shadowing stones. The largest measures 2  cm in diameter. There is no gallbladder wall thickening, pericholecystic fluid, or positive sonographic Murphy's sign. Common bile duct: Diameter: 3.9 mm Liver: The hepatic echotexture is mildly increased. There is no focal mass nor ductal dilation. IMPRESSION: 1. Multiple gallstones without evidence of acute cholecystitis. 2. Fatty infiltrative change of the liver. The common bile duct is normal. Electronically Signed   By: Novah Nessel  [place] M.D.   On: 11/23/2015 08:19   I have personally reviewed and evaluated these images and lab results as part of my medical decision-making.   EKG Interpretation None      MDM   Final diagnoses:  Abdominal pain  Upper abdominal pain   Patient had some improvement of abdominal pain with morphine. No evidence of cholecystitis on laboratory workup or ultrasound. We'll give GI cocktail and potassium replacement.     Loren Raceravid Rachana Malesky, MD 11/23/15 [phone]

## 2016-03-10 DIAGNOSIS — K8020 Calculus of gallbladder without cholecystitis without obstruction: Secondary | ICD-10-CM | POA: Diagnosis not present

## 2016-03-10 DIAGNOSIS — R1013 Epigastric pain: Secondary | ICD-10-CM | POA: Diagnosis not present

## 2016-03-10 DIAGNOSIS — R1030 Lower abdominal pain, unspecified: Secondary | ICD-10-CM | POA: Diagnosis not present

## 2016-03-10 DIAGNOSIS — M549 Dorsalgia, unspecified: Secondary | ICD-10-CM | POA: Diagnosis not present

## 2016-03-11 DIAGNOSIS — R1013 Epigastric pain: Secondary | ICD-10-CM | POA: Diagnosis not present

## 2016-03-11 DIAGNOSIS — R109 Unspecified abdominal pain: Secondary | ICD-10-CM | POA: Diagnosis not present

## 2016-03-11 DIAGNOSIS — Z8249 Family history of ischemic heart disease and other diseases of the circulatory system: Secondary | ICD-10-CM | POA: Diagnosis not present

## 2016-03-11 DIAGNOSIS — K819 Cholecystitis, unspecified: Secondary | ICD-10-CM | POA: Diagnosis not present

## 2016-03-11 DIAGNOSIS — F419 Anxiety disorder, unspecified: Secondary | ICD-10-CM | POA: Diagnosis not present

## 2016-03-11 DIAGNOSIS — Z6841 Body Mass Index (BMI) 40.0 and over, adult: Secondary | ICD-10-CM | POA: Diagnosis not present

## 2016-03-11 DIAGNOSIS — F5104 Psychophysiologic insomnia: Secondary | ICD-10-CM | POA: Diagnosis not present

## 2016-03-11 DIAGNOSIS — K8000 Calculus of gallbladder with acute cholecystitis without obstruction: Secondary | ICD-10-CM | POA: Diagnosis not present

## 2016-03-11 DIAGNOSIS — Z79899 Other long term (current) drug therapy: Secondary | ICD-10-CM | POA: Diagnosis not present

## 2016-03-11 DIAGNOSIS — M549 Dorsalgia, unspecified: Secondary | ICD-10-CM | POA: Diagnosis not present

## 2016-03-11 DIAGNOSIS — K8020 Calculus of gallbladder without cholecystitis without obstruction: Secondary | ICD-10-CM | POA: Diagnosis not present

## 2016-03-11 DIAGNOSIS — R1030 Lower abdominal pain, unspecified: Secondary | ICD-10-CM | POA: Diagnosis not present

## 2016-03-11 DIAGNOSIS — Z87891 Personal history of nicotine dependence: Secondary | ICD-10-CM | POA: Diagnosis not present

## 2016-03-11 DIAGNOSIS — K810 Acute cholecystitis: Secondary | ICD-10-CM | POA: Diagnosis not present

## 2016-03-11 DIAGNOSIS — Z23 Encounter for immunization: Secondary | ICD-10-CM | POA: Diagnosis not present

## 2016-03-11 DIAGNOSIS — K429 Umbilical hernia without obstruction or gangrene: Secondary | ICD-10-CM | POA: Diagnosis not present

## 2016-03-11 DIAGNOSIS — I10 Essential (primary) hypertension: Secondary | ICD-10-CM | POA: Diagnosis not present

## 2016-03-11 DIAGNOSIS — K219 Gastro-esophageal reflux disease without esophagitis: Secondary | ICD-10-CM | POA: Diagnosis not present

## 2016-03-12 DIAGNOSIS — K8000 Calculus of gallbladder with acute cholecystitis without obstruction: Secondary | ICD-10-CM | POA: Diagnosis not present

## 2016-03-12 DIAGNOSIS — I10 Essential (primary) hypertension: Secondary | ICD-10-CM | POA: Diagnosis not present

## 2016-03-12 DIAGNOSIS — F419 Anxiety disorder, unspecified: Secondary | ICD-10-CM | POA: Diagnosis not present

## 2016-03-12 DIAGNOSIS — K819 Cholecystitis, unspecified: Secondary | ICD-10-CM | POA: Diagnosis not present

## 2016-03-13 DIAGNOSIS — K8000 Calculus of gallbladder with acute cholecystitis without obstruction: Secondary | ICD-10-CM | POA: Diagnosis not present

## 2016-03-26 DIAGNOSIS — K219 Gastro-esophageal reflux disease without esophagitis: Secondary | ICD-10-CM | POA: Diagnosis not present

## 2016-03-26 DIAGNOSIS — E6609 Other obesity due to excess calories: Secondary | ICD-10-CM | POA: Diagnosis not present

## 2016-03-26 DIAGNOSIS — I10 Essential (primary) hypertension: Secondary | ICD-10-CM | POA: Diagnosis not present

## 2016-03-26 DIAGNOSIS — J301 Allergic rhinitis due to pollen: Secondary | ICD-10-CM | POA: Diagnosis not present

## 2016-04-04 IMAGING — DX DG FOOT COMPLETE 3+V*R*
3 series · 3 of 3 positions shown · non-contrast
Comparison: None.

CLINICAL DATA: Pain and swelling first MTP joint region for 3 days

EXAM:
RIGHT FOOT COMPLETE - 3+ VIEW

[foot ap]
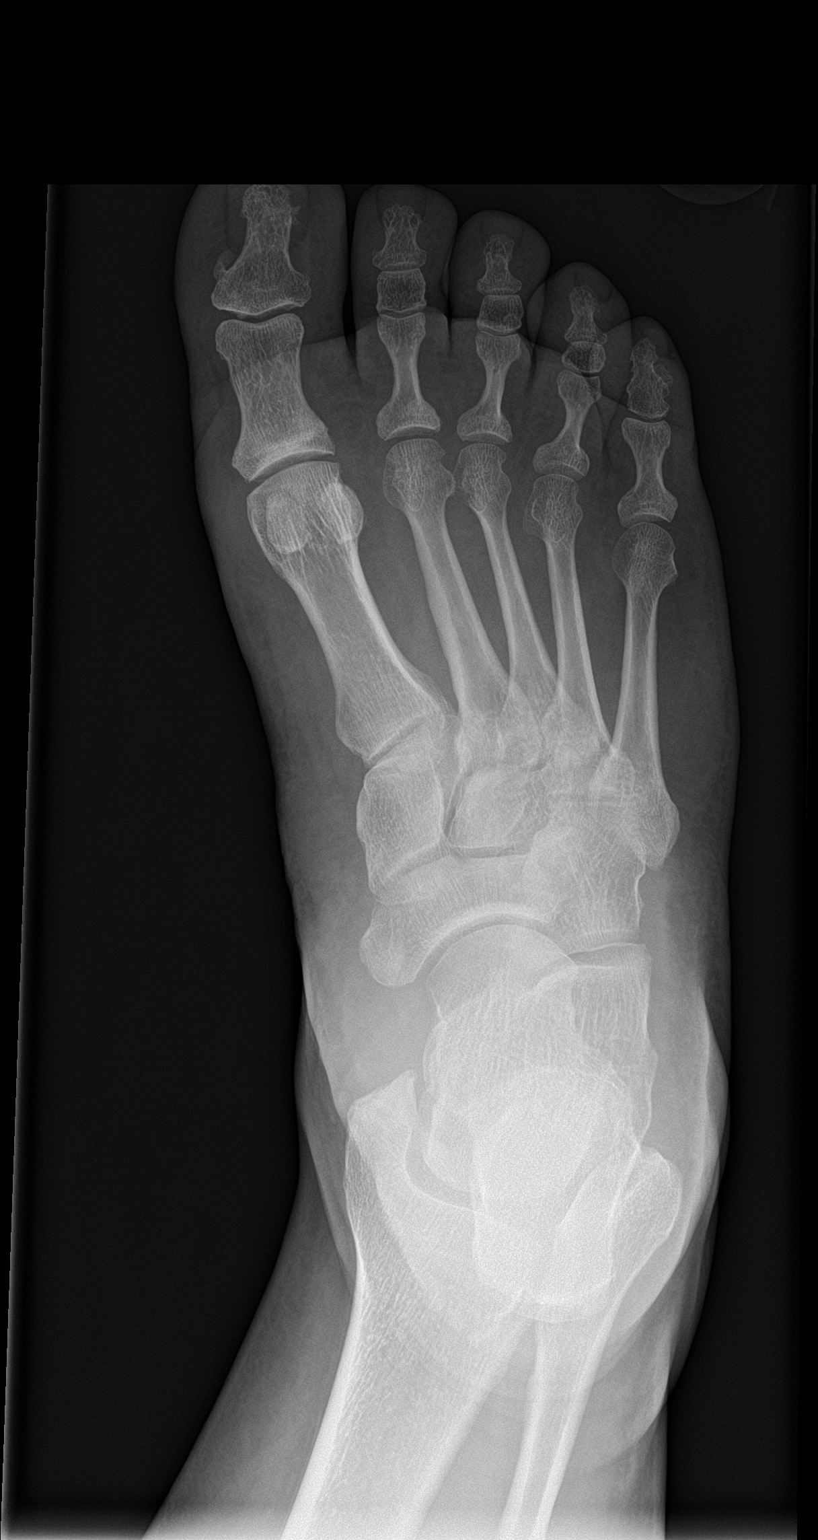

[foot obl]
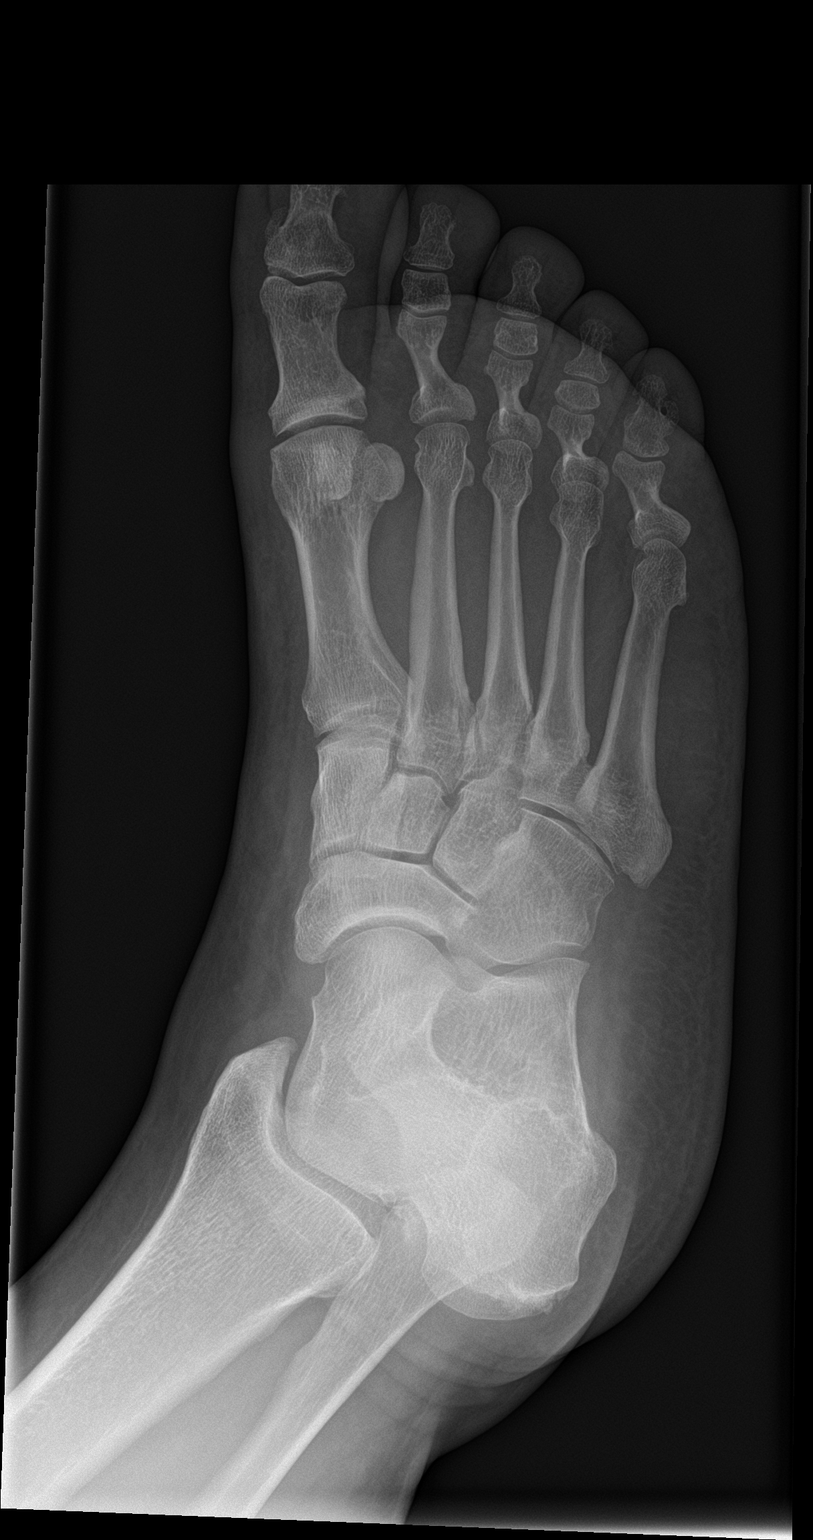

[foot lat]
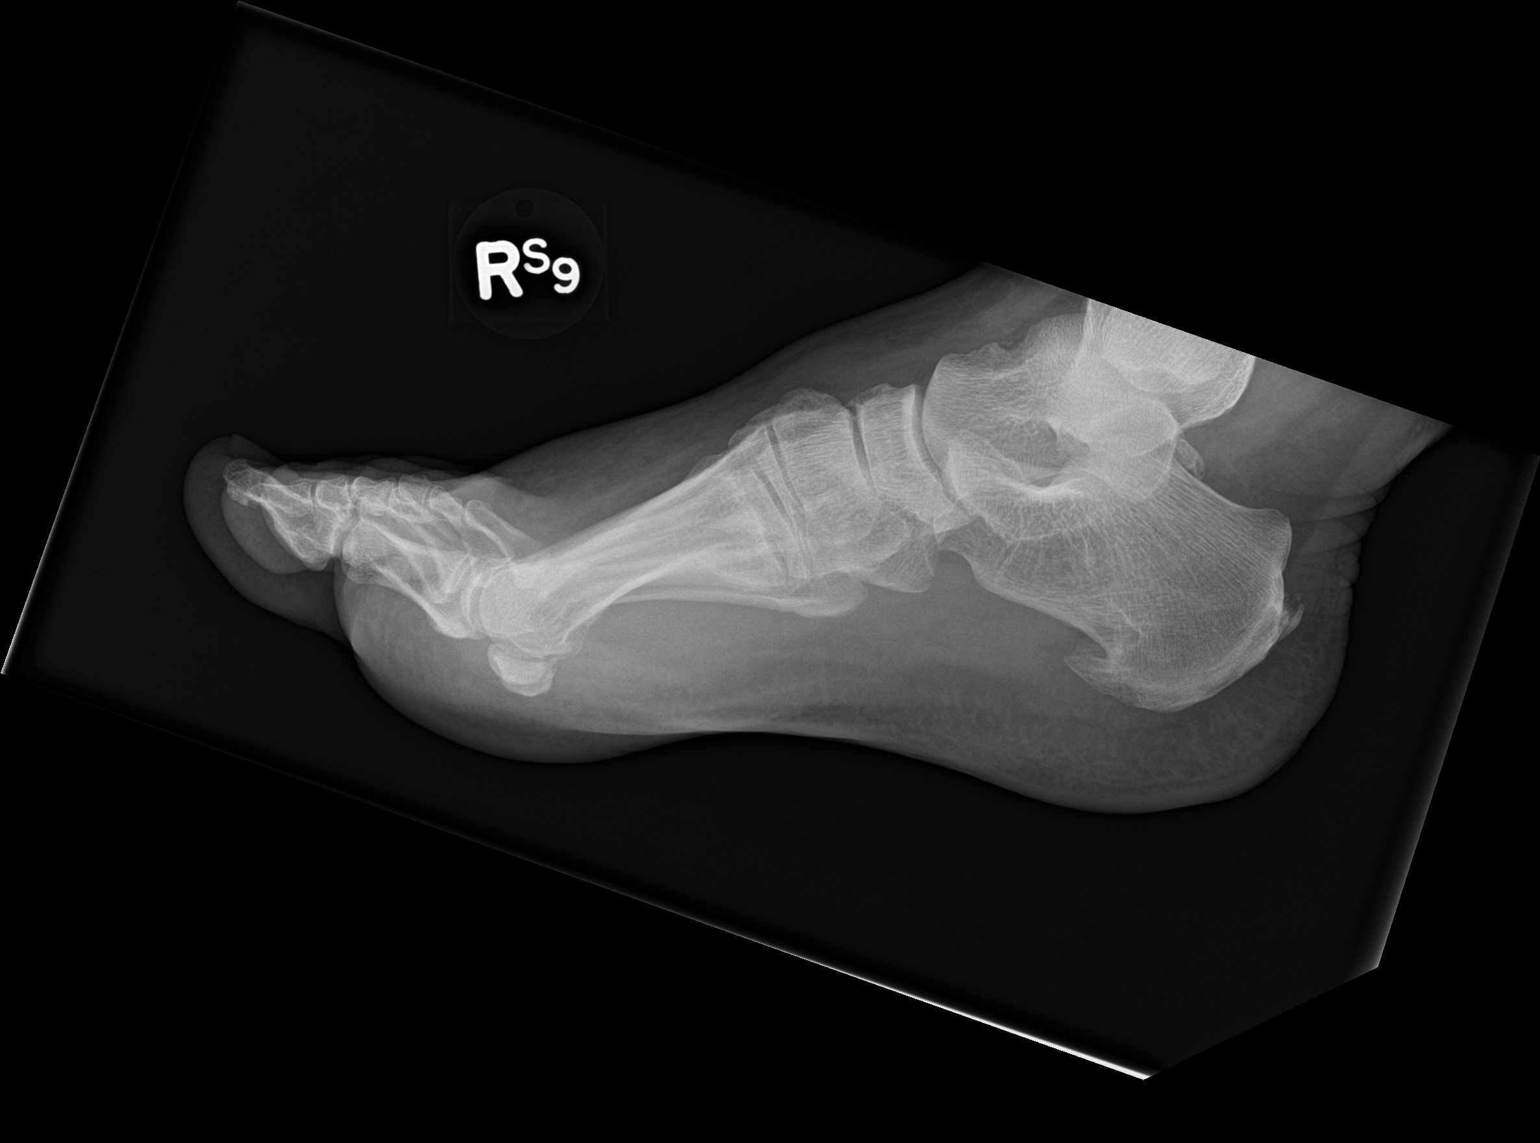

[3 of 3 positions shown; findings below may reference images not displayed]

FINDINGS: Frontal, oblique, and lateral views obtained. There is no
demonstrable fracture or dislocation. There is no appreciable joint
space narrowing. There is mild spurring in the dorsal midfoot. There
are spurs arising from the posterior inferior calcaneus. No erosive
change.
IMPRESSION: Calcaneal spurs. Mild spurring dorsal midfoot. Other joint spaces
appear normal. No fracture or dislocation.

## 2016-04-13 DIAGNOSIS — I10 Essential (primary) hypertension: Secondary | ICD-10-CM | POA: Diagnosis not present

## 2016-04-13 DIAGNOSIS — K219 Gastro-esophageal reflux disease without esophagitis: Secondary | ICD-10-CM | POA: Diagnosis not present

## 2016-10-23 DIAGNOSIS — K219 Gastro-esophageal reflux disease without esophagitis: Secondary | ICD-10-CM | POA: Diagnosis not present

## 2016-10-23 DIAGNOSIS — F5101 Primary insomnia: Secondary | ICD-10-CM | POA: Diagnosis not present

## 2016-10-23 DIAGNOSIS — I10 Essential (primary) hypertension: Secondary | ICD-10-CM | POA: Diagnosis not present

## 2016-10-23 DIAGNOSIS — Z23 Encounter for immunization: Secondary | ICD-10-CM | POA: Diagnosis not present

## 2016-10-23 DIAGNOSIS — J301 Allergic rhinitis due to pollen: Secondary | ICD-10-CM | POA: Diagnosis not present

## 2017-01-31 IMAGING — US US ABDOMEN LIMITED
1 series · 14 of 25 positions shown · non-contrast
Comparison: Abdominal CT scan October 19, 2014

CLINICAL DATA: 10 hours of right upper quadrant pain

EXAM:
US ABDOMEN LIMITED - RIGHT UPPER QUADRANT

[Series 1: us abdomen limited · 0.22mm/px · 14 of 53 slices shown]
[im 1/53]
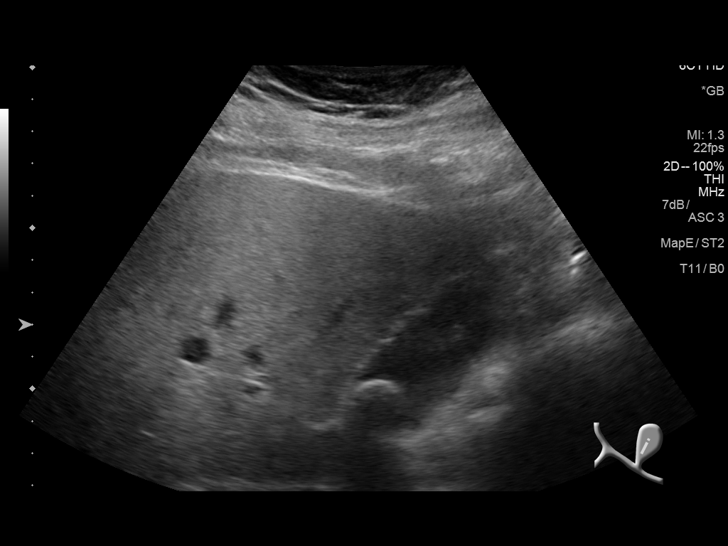
[im 5/53]
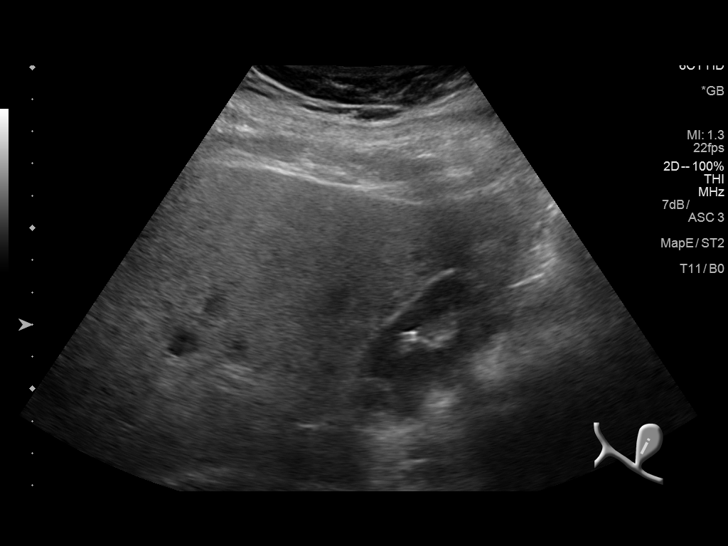
[im 9/53]
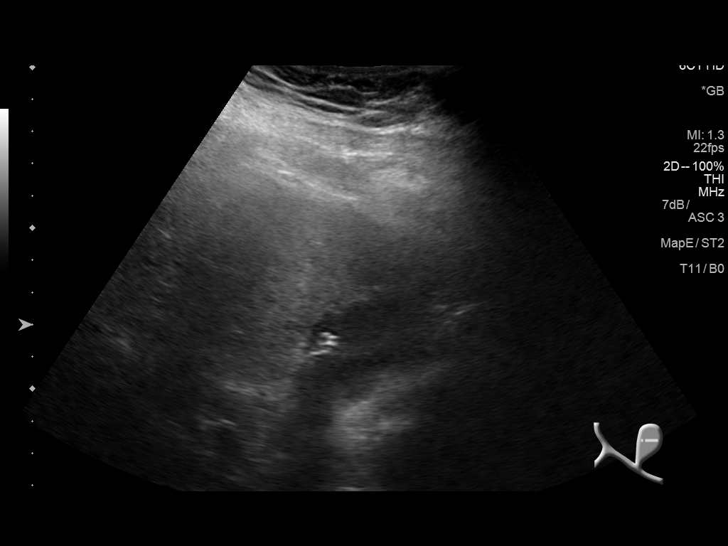
[im 14/53]
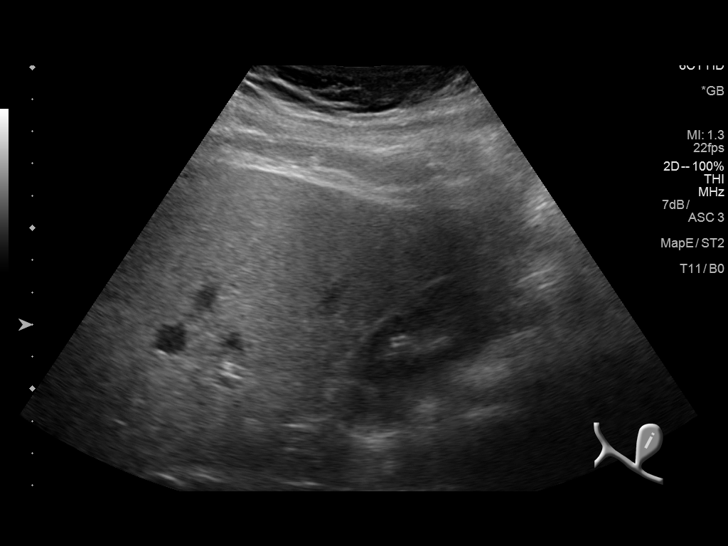
[im 18/53]
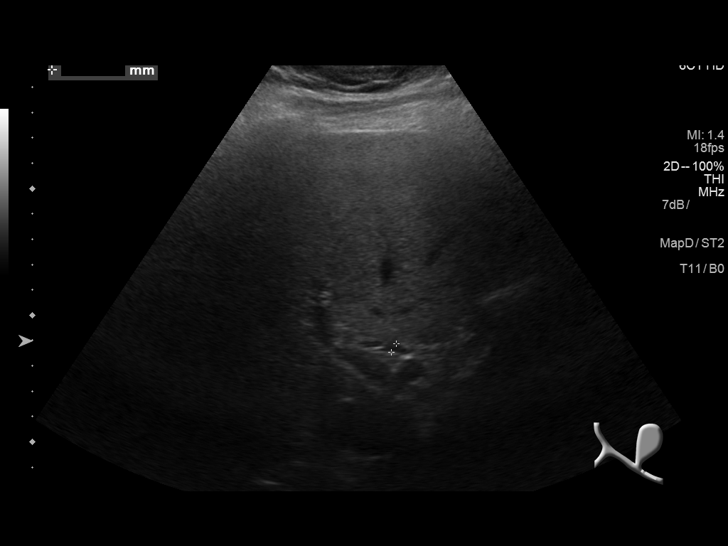
[im 20/53]
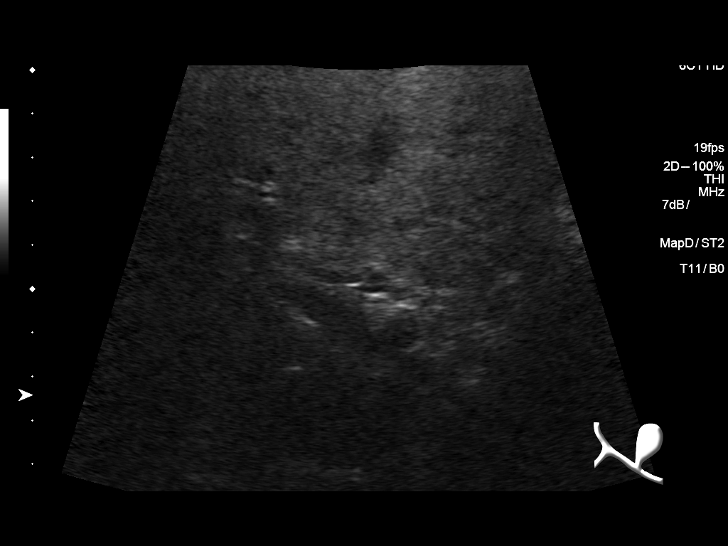
[im 24/53]
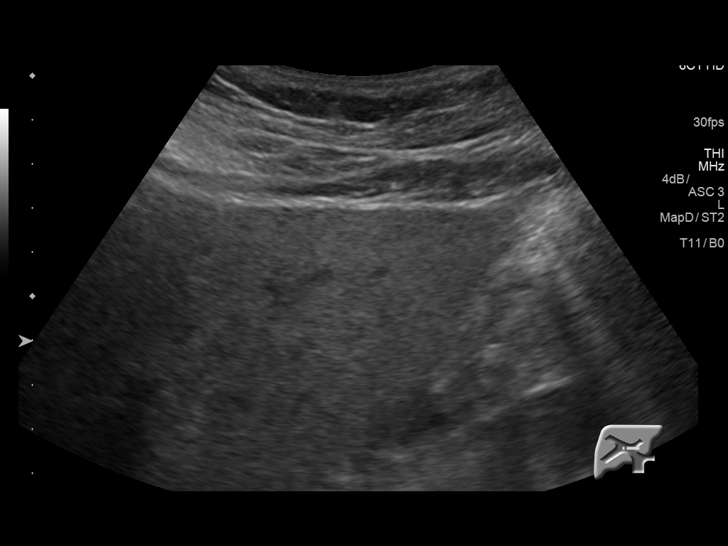
[im 29/53]
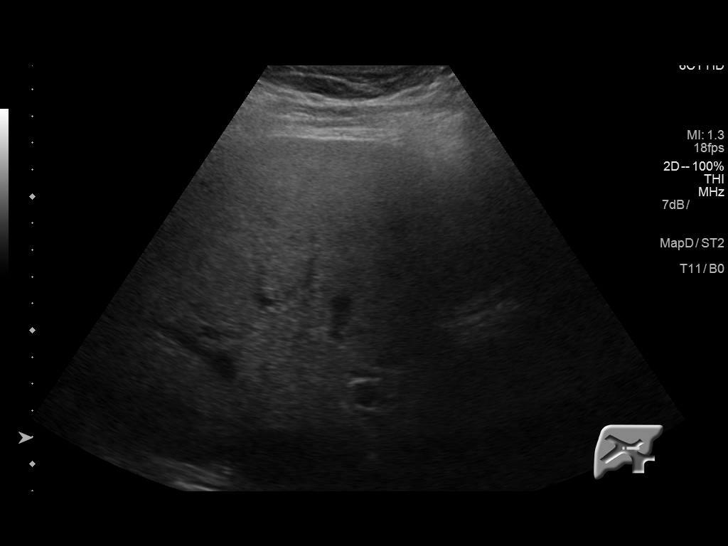
[im 33/53]
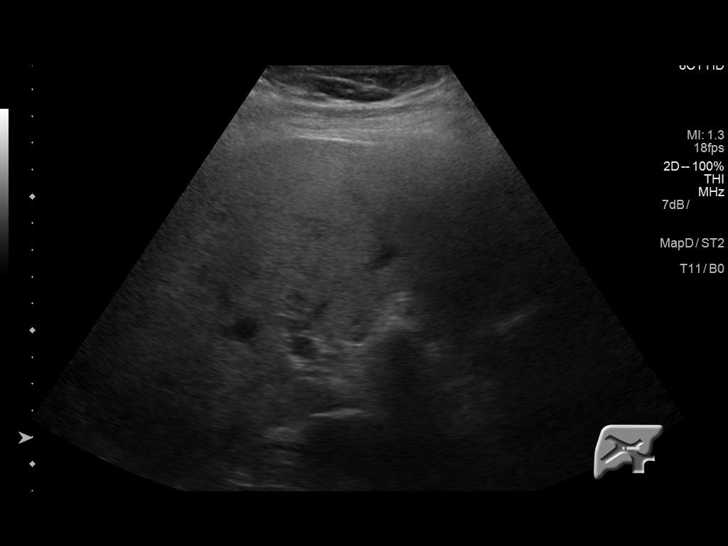
[im 35/53]
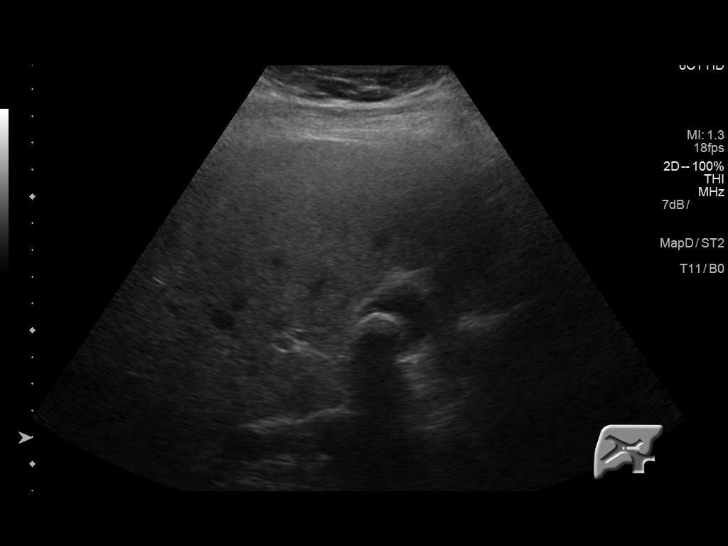
[im 40/53]
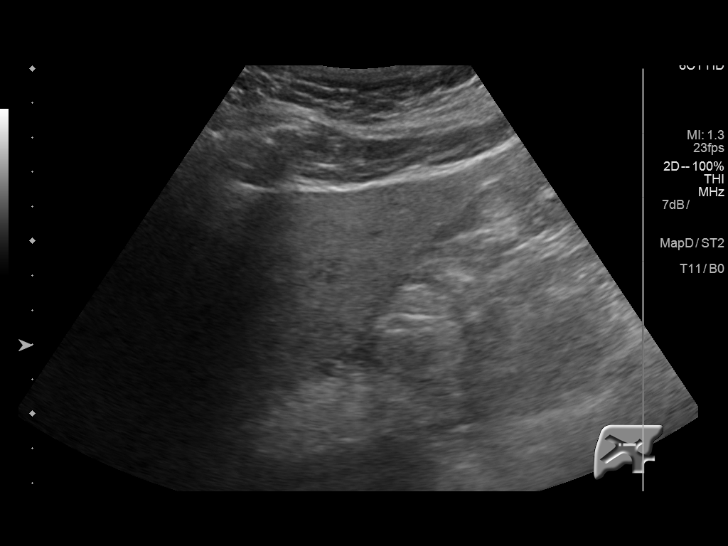
[im 44/53]
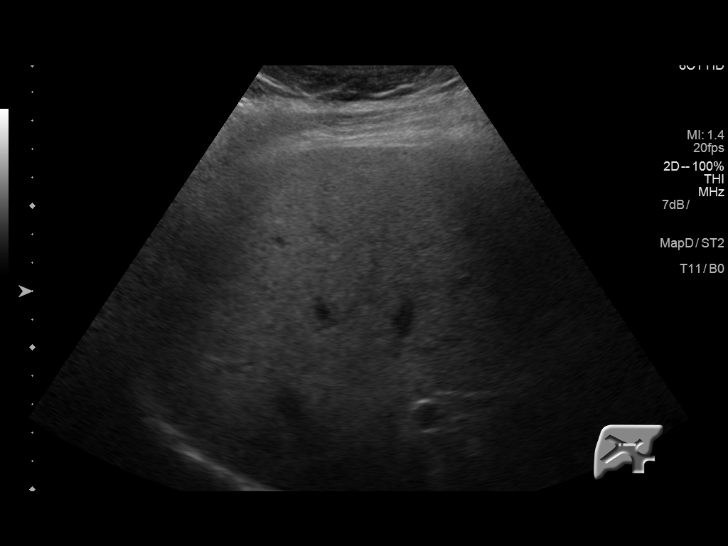
[im 48/53]
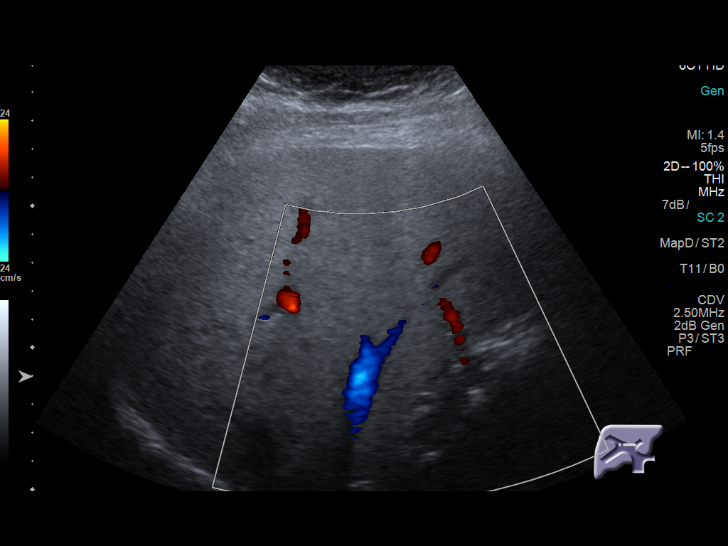
[im 53/53]
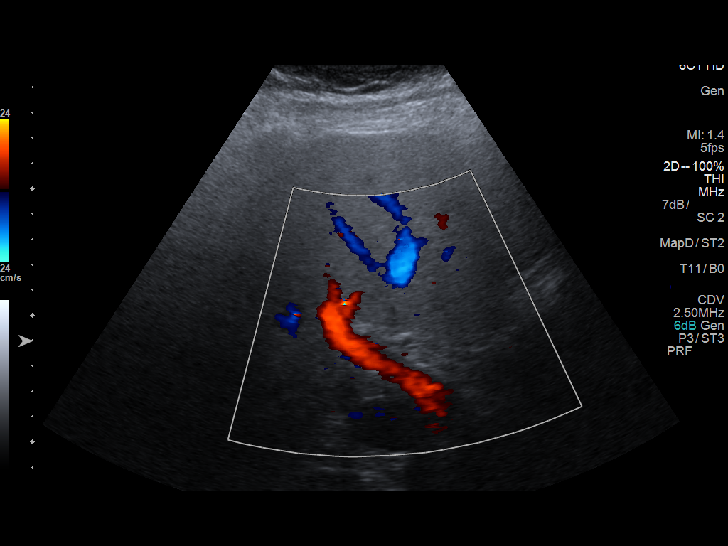

[14 of 25 positions shown; findings below may reference images not displayed]

FINDINGS: Gallbladder:

The gallbladder is adequately distended. There are multiple
echogenic mobile shadowing stones. The largest measures 2 cm in
diameter. There is no gallbladder wall thickening, pericholecystic
fluid, or positive sonographic Murphy's sign.

Common bile duct:

Diameter: 3.9 mm

Liver:

The hepatic echotexture is mildly increased. There is no focal mass
nor ductal dilation.
IMPRESSION: 1. Multiple gallstones without evidence of acute cholecystitis.
2. Fatty infiltrative change of the liver. The common bile duct is
normal.

## 2017-03-27 DIAGNOSIS — Z23 Encounter for immunization: Secondary | ICD-10-CM | POA: Diagnosis not present

## 2017-05-24 DIAGNOSIS — Z0001 Encounter for general adult medical examination with abnormal findings: Secondary | ICD-10-CM | POA: Diagnosis not present

## 2017-05-29 DIAGNOSIS — Z0001 Encounter for general adult medical examination with abnormal findings: Secondary | ICD-10-CM | POA: Diagnosis not present

## 2017-05-29 DIAGNOSIS — J301 Allergic rhinitis due to pollen: Secondary | ICD-10-CM | POA: Diagnosis not present

## 2017-05-29 DIAGNOSIS — Z1212 Encounter for screening for malignant neoplasm of rectum: Secondary | ICD-10-CM | POA: Diagnosis not present

## 2017-05-29 DIAGNOSIS — F5101 Primary insomnia: Secondary | ICD-10-CM | POA: Diagnosis not present

## 2017-05-29 DIAGNOSIS — K219 Gastro-esophageal reflux disease without esophagitis: Secondary | ICD-10-CM | POA: Diagnosis not present

## 2017-05-29 DIAGNOSIS — I10 Essential (primary) hypertension: Secondary | ICD-10-CM | POA: Diagnosis not present

## 2017-07-21 DIAGNOSIS — R0683 Snoring: Secondary | ICD-10-CM | POA: Diagnosis not present

## 2017-07-21 DIAGNOSIS — G4733 Obstructive sleep apnea (adult) (pediatric): Secondary | ICD-10-CM | POA: Diagnosis not present

## 2017-07-29 DIAGNOSIS — G4733 Obstructive sleep apnea (adult) (pediatric): Secondary | ICD-10-CM | POA: Diagnosis not present

## 2017-08-02 DIAGNOSIS — G4733 Obstructive sleep apnea (adult) (pediatric): Secondary | ICD-10-CM | POA: Diagnosis not present

## 2017-08-19 DIAGNOSIS — R1310 Dysphagia, unspecified: Secondary | ICD-10-CM | POA: Diagnosis not present

## 2017-08-19 DIAGNOSIS — Z6841 Body Mass Index (BMI) 40.0 and over, adult: Secondary | ICD-10-CM | POA: Diagnosis not present

## 2017-08-20 ENCOUNTER — Telehealth: Payer: Self-pay | Admitting: Gastroenterology

## 2017-08-20 DIAGNOSIS — R1310 Dysphagia, unspecified: Secondary | ICD-10-CM | POA: Diagnosis not present

## 2017-08-20 DIAGNOSIS — Z6841 Body Mass Index (BMI) 40.0 and over, adult: Secondary | ICD-10-CM | POA: Diagnosis not present

## 2017-08-20 NOTE — Telephone Encounter (Signed)
I have put in slot and will call tomorrow morning.

## 2017-08-20 NOTE — Telephone Encounter (Signed)
[facility] Since Dec patient has lost 20 lbs. Solid food dysphagia, odynophagia. New onset. History of chronic GERD. EGD possibly in remote past, ?20 years ago. Currently on Ranitidine BID. PA has added Carafate. I recommended PPI in interim, drink clear liquids.  I have a cancellation on 3/21 at 0830. Patient needs to be seen this week due to concerning symptoms. He will need EGD expeditiously.  Referral will be coming shortly from [facility]. We just need to make sure patient knows about appt.

## 2017-08-21 NOTE — Telephone Encounter (Signed)
I called pt's spouse's number and she is aware of appt time. She also had me call her back and leave the VM so pt could hear it this afternoon. She said he is in bed now, he did not get much sleep last night.

## 2017-08-21 NOTE — Telephone Encounter (Signed)
noted 

## 2017-08-21 NOTE — Telephone Encounter (Signed)
Tried to call pt. VM not set up. His appt is 08/22/2017 at 8:30 Am with AB.

## 2017-08-22 ENCOUNTER — Encounter: Payer: Self-pay | Admitting: Gastroenterology

## 2017-08-22 ENCOUNTER — Other Ambulatory Visit: Payer: Self-pay

## 2017-08-22 ENCOUNTER — Ambulatory Visit: Payer: BLUE CROSS/BLUE SHIELD | Admitting: Gastroenterology

## 2017-08-22 DIAGNOSIS — R1310 Dysphagia, unspecified: Secondary | ICD-10-CM

## 2017-08-22 DIAGNOSIS — R1319 Other dysphagia: Secondary | ICD-10-CM | POA: Insufficient documentation

## 2017-08-22 MED ORDER — NYSTATIN 100000 UNIT/ML MT SUSP: 5.0000 mL | Freq: Four times a day (QID) | OROMUCOSAL | 0 refills | Status: AC

## 2017-08-22 MED ORDER — LANSOPRAZOLE 30 MG PO TBDP: 30.0000 mg | ORAL_TABLET | Freq: Every day | ORAL | 3 refills | Status: DC

## 2017-08-22 NOTE — Progress Notes (Addendum)
REVIEWED-NO ADDITIONAL RECOMMENDATIONS.  Primary Care Physician:  Caryl Bis, MD Primary Gastroenterologist:  Dr. Oneida Alar   Chief Complaint  Patient presents with  . Dysphagia    x 2 weeks. worse with Solids food  . Weight Loss    HPI:   Trevor Rivera is a 57 y.o. male presenting today at the request of Alyssa, PA with Dr. Gar Ponto. His wife, [name], is present with him today. He has noted new onset dysphagia for the past 2 weeks to solid food. Reports weight loss of 20 lbs since Dec 2018 but states he was putting an effort to losing weight. Labs reviewed completed earlier this week with Hgb 15.0, WBC count mildly elevated at 11.8, Platelets normal 269, BUN 10, Cr 0.93, Tbili 0.4, Alk Phos 100, AST 19, ALT 24, HIV screen non-reactive. H.pylori serology equivocal at 0.83. EGD possibly in remote past.   States he feels a lump in his throat when swallowing. Notes over past few years he would have rare episodes of food getting lodged and then would resolve on own. Crushing pills for now. Eating oatmeal. Would have to regurgitate solid food, drink water to get it down. History of chronic GERD. Taking Zantac 150 mg daily. Seems to control symptoms unless he eats something spicy. No abdominal pain. No N/V. Feels like his esophagus is raw but not significant odynophagia. Has globus sensation. Mild improvement with Carafate. Was taking 2 Aleve every morning chronically but has stopped with dysphagia onset. First noted issues with pills 2 weeks ago, would swallow, drink water, and then water would bubble up and regurgitate. States he started on a CPAP about 2 weeks ago and his throat was sore. He is unsure if this is related or not.   No melena, hematochezia. Had loose stool yesterday. Believes he had an EGD about 15 years ago and believes it was normal. Colonoscopy 2 years ago at [facility] by Dr. Britta Mccreedy.    Prescribed xanax for anxiety but hasn't had in months.    Past Medical History:    Diagnosis Date  . GERD (gastroesophageal reflux disease)   . Hypertension   . Sleep apnea    cpap    Past Surgical History:  Procedure Laterality Date  . CHOLECYSTECTOMY    . ESOPHAGOGASTRODUODENOSCOPY     remote past, > 10 years ago  . UMBILICAL HERNIA REPAIR      Current Outpatient Medications  Medication Sig Dispense Refill  . acetaminophen (TYLENOL) 500 MG tablet Take 1,000 mg by mouth every 6 (six) hours as needed for moderate pain.    Marland Kitchen ALPRAZolam (XANAX) 0.5 MG tablet Take 0.25 mg by mouth daily as needed for anxiety.    Marland Kitchen amLODipine (NORVASC) 10 MG tablet Take 10 mg by mouth every evening.   1  . CARAFATE 1 GM/10ML suspension 10 mLs 4 (four) times daily.  0  . Docusate Calcium (STOOL SOFTENER PO) Take by mouth daily.    Marland Kitchen lisinopril-hydrochlorothiazide (PRINZIDE,ZESTORETIC) 20-12.5 MG per tablet Take 2 tablets by mouth every evening.   3  . ranitidine (ZANTAC) 150 MG capsule Take 1 capsule (150 mg total) by mouth 2 (two) times daily. (Patient taking differently: Take 150 mg by mouth daily. ) 30 capsule 0  . zolpidem (AMBIEN) 10 MG tablet Take 10 mg by mouth at bedtime.  1  . lansoprazole (PREVACID SOLUTAB) 30 MG disintegrating tablet Take 1 tablet (30 mg total) by mouth daily. 30 minutes before breakfast 30 tablet 3  . nystatin (  MYCOSTATIN) 100000 UNIT/ML suspension Take 5 mLs (500,000 Units total) by mouth 4 (four) times daily. Swish and swallow 473 mL 0   No current facility-administered medications for this visit.     Allergies as of 08/22/2017  . (No Known Allergies)    Family History  Problem Relation Age of Onset  . Colon cancer Neg Hx   . Colon polyps Neg Hx     Social History   Socioeconomic History  . Marital status: Married    Spouse name: Not on file  . Number of children: Not on file  . Years of education: Not on file  . Highest education level: Not on file  Occupational History  . Not on file  Social Needs  . Financial resource strain: Not on  file  . Food insecurity:    Worry: Not on file    Inability: Not on file  . Transportation needs:    Medical: Not on file    Non-medical: Not on file  Tobacco Use  . Smoking status: Former Smoker    Types: Cigarettes  . Smokeless tobacco: Current User    Types: Snuff  . Tobacco comment: quit smoking 2014 or so  Substance and Sexual Activity  . Alcohol use: No  . Drug use: No  . Sexual activity: Not on file  Lifestyle  . Physical activity:    Days per week: Not on file    Minutes per session: Not on file  . Stress: Not on file  Relationships  . Social connections:    Talks on phone: Not on file    Gets together: Not on file    Attends religious service: Not on file    Active member of club or organization: Not on file    Attends meetings of clubs or organizations: Not on file    Relationship status: Not on file  . Intimate partner violence:    Fear of current or ex partner: Not on file    Emotionally abused: Not on file    Physically abused: Not on file    Forced sexual activity: Not on file  Other Topics Concern  . Not on file  Social History Narrative  . Not on file    Review of Systems: Gen: Denies any fever, chills, fatigue, weight loss, lack of appetite.  CV: Denies chest pain, heart palpitations, peripheral edema, syncope.  Resp: Denies shortness of breath at rest or with exertion. Denies wheezing or cough.  GI: see HPI  GU : Denies urinary burning, urinary frequency, urinary hesitancy MS: Denies joint pain, muscle weakness, cramps, or limitation of movement.  Derm: Denies rash, itching, dry skin Psych: Denies depression, anxiety, memory loss, and confusion Heme: Denies bruising, bleeding, and enlarged lymph nodes.  Physical Exam: BP (!) 141/93   Pulse (!) 113   Temp 97.9 F (36.6 C) (Oral)   Ht _0  (1.626 m)   Wt 246 lb 3.2 oz (111.7 kg)   BMI 42.26 kg/m  General:   Alert and oriented. Pleasant and cooperative. Well-nourished and well-developed.  Slightly anxious regarding health condition.  Head:  Normocephalic and atraumatic. Eyes:  Without icterus, sclera clear and conjunctiva pink.  Ears:  Normal auditory acuity. Nose:  No deformity, discharge,  or lesions. Mouth:  No deformity or lesions, oral mucosa pink. Possible oral thrus Lungs:  Clear to auscultation bilaterally. No wheezes, rales, or rhonchi. No distress.  Heart:  S1, S2 present without murmurs appreciated.  Abdomen:  +BS, soft, non-tender and  non-distended. No HSM noted. No guarding or rebound. No masses appreciated.  Rectal:  Deferred  Msk:  Symmetrical without gross deformities. Normal posture. Extremities:  Without edema. Neurologic:  Alert and  oriented x4 Psych:  Alert and cooperative. Normal mood and affect.

## 2017-08-22 NOTE — Progress Notes (Signed)
CC'ED TO PCP 

## 2017-08-22 NOTE — Patient Instructions (Addendum)
I would stop Carafate for now. I have sent in Nystatin to the pharmacy to swish and swallow 4 times a day. Hold in mouth as long as you can, then swallow.  I have sent in Prevacid solutabs to take each morning, 30 minutes before breakfast. Insurance may or may not cover this.  We have arranged an upper endoscopy with dilation with Dr. Darrick PennaFields next week.  In the interim, continue sipping on liquids, stay on soft foods/full liquids, avoid eating 3 hours prior to bed, sit upright while eating, chew very well, and take small bites.  It was a pleasure to see you today. I strive to create trusting relationships with patients to provide genuine, compassionate, and quality care. I value your feedback. If you receive a survey regarding your visit,  I greatly appreciate you taking time to fill this out.   Gelene MinkAnna W. Beronica Lansdale, PhD, ANP-BC [facility]

## 2017-08-22 NOTE — Assessment & Plan Note (Addendum)
57 year old pleasant male with new onset dysphagia starting 2 weeks ago, only able to tolerate liquids and foods with textures such as oatmeal, mashed potatoes. Notes regurgitation of liquids and pills, so he has been crushing pills. History of chronic GERD, on Zantac but no PPI. EGD remotely about 15 years ago, stating [facility] was [facility] and normal. He notes that he started wearing a CPAP machine at night 2 weeks ago and wonders if his symptoms are related to this; however, he does have primary esophageal symptoms and needs diagnostic EGD in near future. Oral exam with possible oral thrush. As of note, he reports 20 lbs weight loss since December 2018; however, he notes earlier this year he was attempting to eat healthier. Likely multifactorial in setting of decreased oral intake and new onset upper GI symptoms. No concerning laboratory findings. H.pylori serology equivocal from PCP: not indicative of current infection and could possibly represent past exposure.   Start nystatin swish and swallow for now Prevacid solutab sent to pharmacy Stop Carafate Soft diet/full liquids Swallowing precautions reviewed Proceed with upper endoscopy/dilation in the near future with Dr. Darrick PennaFields. The risks, benefits, and alternatives have been discussed in detail with patient. They have stated understanding and desire to proceed.  Phenergan 12.5 mg IV on call

## 2017-08-26 DIAGNOSIS — G4733 Obstructive sleep apnea (adult) (pediatric): Secondary | ICD-10-CM | POA: Diagnosis not present

## 2017-08-30 ENCOUNTER — Ambulatory Visit (HOSPITAL_COMMUNITY)
Admission: RE | Admit: 2017-08-30 | Discharge: 2017-08-30 | Disposition: A | Discharge status: Home / Self Care | Payer: BLUE CROSS/BLUE SHIELD | Source: Ambulatory Visit | Attending: Gastroenterology | Admitting: Gastroenterology

## 2017-08-30 ENCOUNTER — Other Ambulatory Visit: Payer: Self-pay

## 2017-08-30 ENCOUNTER — Encounter (HOSPITAL_COMMUNITY)
Admission: RE | Disposition: A | Discharge status: Home / Self Care | Payer: Self-pay | Source: Ambulatory Visit | Attending: Gastroenterology

## 2017-08-30 ENCOUNTER — Encounter (HOSPITAL_COMMUNITY): Payer: Self-pay

## 2017-08-30 DIAGNOSIS — Z79899 Other long term (current) drug therapy: Secondary | ICD-10-CM | POA: Insufficient documentation

## 2017-08-30 DIAGNOSIS — K2950 Unspecified chronic gastritis without bleeding: Secondary | ICD-10-CM | POA: Diagnosis not present

## 2017-08-30 DIAGNOSIS — I10 Essential (primary) hypertension: Secondary | ICD-10-CM | POA: Insufficient documentation

## 2017-08-30 DIAGNOSIS — K219 Gastro-esophageal reflux disease without esophagitis: Secondary | ICD-10-CM | POA: Diagnosis not present

## 2017-08-30 DIAGNOSIS — K2970 Gastritis, unspecified, without bleeding: Secondary | ICD-10-CM | POA: Diagnosis not present

## 2017-08-30 DIAGNOSIS — Z87891 Personal history of nicotine dependence: Secondary | ICD-10-CM | POA: Diagnosis not present

## 2017-08-30 DIAGNOSIS — G4730 Sleep apnea, unspecified: Secondary | ICD-10-CM | POA: Insufficient documentation

## 2017-08-30 DIAGNOSIS — K222 Esophageal obstruction: Secondary | ICD-10-CM

## 2017-08-30 DIAGNOSIS — R1310 Dysphagia, unspecified: Secondary | ICD-10-CM | POA: Diagnosis not present

## 2017-08-30 DIAGNOSIS — R1319 Other dysphagia: Secondary | ICD-10-CM

## 2017-08-30 HISTORY — PX: ESOPHAGOGASTRODUODENOSCOPY: SHX5428

## 2017-08-30 HISTORY — PX: SAVORY DILATION: SHX5439

## 2017-08-30 SURGERY — EGD (ESOPHAGOGASTRODUODENOSCOPY)
Anesthesia: Moderate Sedation

## 2017-08-30 MED ORDER — MEPERIDINE HCL 100 MG/ML IJ SOLN
INTRAMUSCULAR | Status: AC
  Filled 2017-08-30: qty 2

## 2017-08-30 MED ORDER — LIDOCAINE VISCOUS 2 % MT SOLN
OROMUCOSAL | Status: DC | PRN
  Administered 2017-08-30: 1 via OROMUCOSAL

## 2017-08-30 MED ORDER — RANITIDINE HCL 150 MG PO CAPS: ORAL_CAPSULE | ORAL | 11 refills | Status: AC

## 2017-08-30 MED ORDER — SIMETHICONE 40 MG/0.6ML PO SUSP
ORAL | Status: DC | PRN
  Administered 2017-08-30: 15 mL

## 2017-08-30 MED ORDER — LIDOCAINE VISCOUS 2 % MT SOLN
OROMUCOSAL | Status: AC
  Filled 2017-08-30: qty 15

## 2017-08-30 MED ORDER — PROMETHAZINE HCL 25 MG/ML IJ SOLN: 12.5000 mg | Freq: Four times a day (QID) | INTRAMUSCULAR | Status: DC | PRN

## 2017-08-30 MED ORDER — SODIUM CHLORIDE 0.9 % IV SOLN
INTRAVENOUS | Status: DC
  Administered 2017-08-30: 14:00:00 via INTRAVENOUS

## 2017-08-30 MED ORDER — LANSOPRAZOLE 30 MG PO TBDP: ORAL_TABLET | ORAL | 11 refills | Status: DC

## 2017-08-30 MED ORDER — MIDAZOLAM HCL 5 MG/5ML IJ SOLN
INTRAMUSCULAR | Status: AC
  Filled 2017-08-30: qty 10

## 2017-08-30 MED ORDER — MIDAZOLAM HCL 5 MG/5ML IJ SOLN
INTRAMUSCULAR | Status: DC | PRN
  Administered 2017-08-30 (×2): 2 mg via INTRAVENOUS
  Administered 2017-08-30: 1 mg via INTRAVENOUS
  Administered 2017-08-30 (×2): 2 mg via INTRAVENOUS

## 2017-08-30 MED ORDER — LANSOPRAZOLE 30 MG PO CPDR: DELAYED_RELEASE_CAPSULE | ORAL | 11 refills | Status: DC

## 2017-08-30 MED ORDER — PROMETHAZINE HCL 25 MG/ML IJ SOLN
INTRAMUSCULAR | Status: AC
  Administered 2017-08-30: 12.5 mg
  Filled 2017-08-30: qty 1

## 2017-08-30 MED ORDER — MEPERIDINE HCL 100 MG/ML IJ SOLN
INTRAMUSCULAR | Status: DC | PRN
  Administered 2017-08-30: 25 mg via INTRAVENOUS
  Administered 2017-08-30: 25 mg
  Administered 2017-08-30: 25 mg via INTRAVENOUS

## 2017-08-30 MED ORDER — SODIUM CHLORIDE 0.9% FLUSH
INTRAVENOUS | Status: AC
  Filled 2017-08-30: qty 10

## 2017-08-30 MED ORDER — MINERAL OIL PO OIL
TOPICAL_OIL | ORAL | Status: AC
  Filled 2017-08-30: qty 30

## 2017-08-30 NOTE — H&P (Signed)
Primary Care Physician:  Richardean Chimera, MD Primary Gastroenterologist:  Dr. Darrick Penna  Pre-Procedure History & Physical: HPI:  Trevor Rivera is a 57 y.o. male here for DYSPHAGIA.  Past Medical History:  Diagnosis Date  . GERD (gastroesophageal reflux disease)   . Hypertension   . Sleep apnea    cpap    Past Surgical History:  Procedure Laterality Date  . CHOLECYSTECTOMY    . ESOPHAGOGASTRODUODENOSCOPY     remote past, > 10 years ago  . UMBILICAL HERNIA REPAIR      Prior to Admission medications   Medication Sig Start Date End Date Taking? Authorizing Provider  acetaminophen (TYLENOL) 500 MG tablet Take 1,000 mg by mouth every 6 (six) hours as needed for moderate pain.   Yes [provider]  amLODipine (NORVASC) 10 MG tablet Take 10 mg by mouth every evening.  07/24/14  Yes [provider]  Docusate Calcium (STOOL SOFTENER PO) Take by mouth daily.   Yes [provider]  lansoprazole (PREVACID SOLUTAB) 30 MG disintegrating tablet Take 1 tablet (30 mg total) by mouth daily. 30 minutes before breakfast 08/22/17  Yes Gelene Mink, NP  lisinopril-hydrochlorothiazide (PRINZIDE,ZESTORETIC) 20-12.5 MG per tablet Take 2 tablets by mouth every evening.  10/01/14  Yes [provider]  nystatin (MYCOSTATIN) 100000 UNIT/ML suspension Take 5 mLs (500,000 Units total) by mouth 4 (four) times daily. Swish and swallow 08/22/17  Yes Gelene Mink, NP  ranitidine (ZANTAC) 150 MG capsule Take 1 capsule (150 mg total) by mouth 2 (two) times daily. Patient taking differently: Take 150 mg by mouth daily.  10/19/14  Yes Eber Hong, MD  zolpidem (AMBIEN) 10 MG tablet Take 10 mg by mouth at bedtime. 08/05/14  Yes [provider]  ALPRAZolam Prudy Feeler) 0.5 MG tablet Take 0.25 mg by mouth daily as needed for anxiety.    [provider]  CARAFATE 1 GM/10ML suspension 10 mLs 4 (four) times daily. 08/19/17   [provider]    Allergies as of  08/22/2017  . (No Known Allergies)    Family History  Problem Relation Age of Onset  . Colon cancer Neg Hx   . Colon polyps Neg Hx     Social History   Socioeconomic History  . Marital status: Married    Spouse name: Not on file  . Number of children: Not on file  . Years of education: Not on file  . Highest education level: Not on file  Occupational History  . Not on file  Social Needs  . Financial resource strain: Not on file  . Food insecurity:    Worry: Not on file    Inability: Not on file  . Transportation needs:    Medical: Not on file    Non-medical: Not on file  Tobacco Use  . Smoking status: Former Smoker    Types: Cigarettes  . Smokeless tobacco: Current User    Types: Snuff  . Tobacco comment: quit smoking 2014 or so  Substance and Sexual Activity  . Alcohol use: No  . Drug use: No  . Sexual activity: Not on file  Lifestyle  . Physical activity:    Days per week: Not on file    Minutes per session: Not on file  . Stress: Not on file  Relationships  . Social connections:    Talks on phone: Not on file    Gets together: Not on file    Attends religious service: Not on file  Active member of club or organization: Not on file    Attends meetings of clubs or organizations: Not on file    Relationship status: Not on file  . Intimate partner violence:    Fear of current or ex partner: Not on file    Emotionally abused: Not on file    Physically abused: Not on file    Forced sexual activity: Not on file  Other Topics Concern  . Not on file  Social History Narrative  . Not on file    Review of Systems: See HPI, otherwise negative ROS   Physical Exam: BP 119/74   Pulse 93   Temp 98.3 F (36.8 C) (Oral)   Resp 13   SpO2 98%  General:   Alert,  pleasant and cooperative in NAD Head:  Normocephalic and atraumatic. Neck:  Supple; Lungs:  Clear throughout to auscultation.    Heart:  Regular rate and rhythm. Abdomen:  Soft, nontender and  nondistended. Normal bowel sounds, without guarding, and without rebound.   Neurologic:  Alert and  oriented x4;  grossly normal neurologically.  Impression/Plan:     DYSPHAGIA  PLAN:  EGD/DIL TODAY DISCUSSED PROCEDURE, BENEFITS, & RISKS: < 1% chance of medication reaction, bleeding, OR perforation.

## 2017-08-30 NOTE — Op Note (Signed)
[facility] Patient Name: Trevor Rivera Procedure Date: 08/30/2017 2:28 PM MRN: [id number] Date of Birth: 08-16-1960 Attending MD: Jonette Eva MD, MD CSN: [id number] Age: 57 Admit Type: Outpatient Procedure:                Upper GI endoscopy WITH COLD FORCEPS                            BIOPSY/ESOPHAGEAL DILATION Indications:              Dysphagia Providers:                Jonette Eva MD, MD, Buel Ream. Thomasena Edis RN, RN,                            Edythe Clarity, Technician Referring MD:             Lucita Lora. Daniel MD, MD Medicines:                Meperidine 75 mg IV, Midazolam 9 mg IV Complications:            No immediate complications. Estimated Blood Loss:     Estimated blood loss was minimal. Procedure:                Pre-Anesthesia Assessment:                           - Prior to the procedure, a History and Physical                            was performed, and patient medications and                            allergies were reviewed. The patient's tolerance of                            previous anesthesia was also reviewed. The risks                            and benefits of the procedure and the sedation                            options and risks were discussed with the patient.                            All questions were answered, and informed consent                            was obtained. Prior Anticoagulants: The patient has                            taken no previous anticoagulant or antiplatelet                            agents. ASA Grade Assessment: II - A patient with  mild systemic disease. After reviewing the risks                            and benefits, the patient was deemed in                            satisfactory condition to undergo the procedure.                            After obtaining informed consent, the endoscope was                            passed under direct vision. Throughout the                             procedure, the patient's blood pressure, pulse, and                            oxygen saturations were monitored continuously. The                            [id number] ([id number]) scope was introduced through the                            mouth, and advanced to the second part of duodenum.                            The upper GI endoscopy was accomplished without                            difficulty. The patient tolerated the procedure                            well. Scope In: 2:57:57 PM Scope Out: 3:10:06 PM Total Procedure Duration: 0 hours 12 minutes 9 seconds  Findings:      One moderate (circumferential scarring or stenosis; an endoscope may       pass) benign-appearing, intrinsic stenosis was found. This measured 1.3       cm (inner diameter) and was traversed. A guidewire was placed and the       scope was withdrawn. Dilation was performed with a Savary dilator with       mild resistance at 14 mm and moderate resistance at 15 mm, 16 mm and 17       mm. Estimated blood loss was minimal.      Localized mild inflammation characterized by congestion (edema),       erosions and erythema was found in the gastric antrum. Biopsies(3:       ANTRUM, 2: BODY) were taken with a cold forceps for Helicobacter pylori       testing.      The examined duodenum was normal. Impression:               - Benign-appearing esophageal STRICTURE LIKELY DUE  TO REFLUX                           - MILD Gastritis. Moderate Sedation:      Moderate (conscious) sedation was administered by the endoscopy nurse       and supervised by the endoscopist. The following parameters were       monitored: oxygen saturation, heart rate, blood pressure, and response       to care. Total physician intraservice time was 27 minutes. Recommendation:           - Await pathology results.                           - Low fat diet.                           - Continue present medications.                            - Return to my office in 4 months.                           - Patient has a contact number available for                            emergencies. The signs and symptoms of potential                            delayed complications were discussed with the                            patient. Return to normal activities tomorrow.                            Written discharge instructions were provided to the                            patient. Procedure Code(s):        --- Professional ---                           [phone], Esophagogastroduodenoscopy, flexible,                            transoral; with insertion of guide wire followed by                            passage of dilator(s) through esophagus over guide                            wire                           [id number], Esophagogastroduodenoscopy, flexible,                            transoral; with biopsy, single or multiple  [id number], Moderate sedation services provided by the                            same physician or other qualified health care                            professional performing the diagnostic or                            therapeutic service that the sedation supports,                            requiring the presence of an independent trained                            observer to assist in the monitoring of the                            patient's level of consciousness and physiological                            status; initial 15 minutes of intraservice time,                            patient age 2 years or older                           [phone], Moderate sedation services; each additional                            15 minutes intraservice time Diagnosis Code(s):        --- Professional ---                           K22.2, Esophageal obstruction                           K29.70, Gastritis, unspecified, without bleeding                           R13.10, Dysphagia,  unspecified CPT copyright 2016 [employer]. All rights reserved. The codes documented in this report are preliminary and upon coder review may  be revised to meet current compliance requirements. Jonette EvaSandi Jolena Kittle, MD Jonette EvaSandi Ahlia Lemanski MD, MD 08/30/2017 3:22:02 PM This report has been signed electronically. Number of Addenda: 0

## 2017-08-30 NOTE — Discharge Instructions (Signed)
I dilated your esophagus. You have a stricture near the base of your esophagus LIKELY DUE TO UNCONTROLLED REFLUX.  You have mild gastritis. I biopsied your stomach.   DRINK WATER TO KEEP YOUR URINE LIGHT YELLOW.  AVOID REFLUX TRIGGERS. SEE INFO BELOW.  STRICTLY FOLLOW A LOW FAT DIET. SEE INFO BELOW.  CONTINUE YOUR WEIGHT LOSS EFFORTS.  WHILE I DO NOT WANT TO ALARM YOU, YOUR BODY MASS INDEX IS OVER 40 WHICH MEANS YOU ARE MORBIDLY OBESE. OBESITY IS ASSOCIATED WITH AN INCREASED RISK FOR CIRRHOSIS AND ALL CANCERS, INCLUDING ESOPHAGEAL AND COLON CANCER. FIRST GET YOUR BMI UNDER 40. A WEIGHT OF 230 LBS WILL GET YOUR BODY MASS INDEX(BMI) UNDER 40. A WEIGHT OF 170 LBS OR LESS WILL KEEP YOUR BODY MASS INDEX(BMI) UNDER 30. IF YOU FEEL YOU NEED A REFERRAL FOR WEIGHT LOSS SURGERY PLEASE LET ME KNOW.   STOP CARAFATE.  CONTINUE PREVACID. INCREASE TO 30 MINUTES PRIOR TO BREAKFAST AND SUPPER.   YOUR BIOPSY WILL BE BACK IN 7 DAYS  PLEASE CALL IN ONE MONTH IF YOUR SWALLOWING IS NOT IMPROVED.    FOLLOW UP IN 4 MOS.  UPPER ENDOSCOPY AFTER CARE Read the instructions outlined below and refer to this sheet in the next week. These discharge instructions provide you with general information on caring for yourself after you leave the hospital. While your treatment has been planned according to the most current medical practices available, unavoidable complications occasionally occur. If you have any problems or questions after discharge, call DR. Sherri Levenhagen, [phone].  ACTIVITY  You may resume your regular activity, but move at a slower pace for the next 24 hours.   Take frequent rest periods for the next 24 hours.   Walking will help get rid of the air and reduce the bloated feeling in your belly (abdomen).   No driving for 24 hours (because of the medicine (anesthesia) used during the test).   You may shower.   Do not sign any important legal documents or operate any machinery for 24 hours (because  of the anesthesia used during the test).    NUTRITION  Drink plenty of fluids.   You may resume your normal diet as instructed by your doctor.   Begin with a light meal and progress to your normal diet. Heavy or fried foods are harder to digest and may make you feel sick to your stomach (nauseated).   Avoid alcoholic beverages for 24 hours or as instructed.    MEDICATIONS  You may resume your normal medications.   WHAT YOU CAN EXPECT TODAY  Some feelings of bloating in the abdomen.   Passage of more gas than usual.    IF YOU HAD A BIOPSY TAKEN DURING THE UPPER ENDOSCOPY:  Eat a soft diet IF YOU HAVE NAUSEA, BLOATING, ABDOMINAL PAIN, OR VOMITING.    FINDING OUT THE RESULTS OF YOUR TEST Not all test results are available during your visit. DR. Darrick PennaFIELDS WILL CALL YOU WITHIN 14 DAYS OF YOUR PROCEDUE WITH YOUR RESULTS. Do not assume everything is normal if you have not heard from DR. Annaclaire Walsworth, CALL HER OFFICE AT [phone].  SEEK IMMEDIATE MEDICAL ATTENTION AND CALL THE OFFICE: [phone] IF:  You have more than a spotting of blood in your stool.   Your belly is swollen (abdominal distention).   You are nauseated or vomiting.   You have a temperature over 101F.   You have abdominal pain or discomfort that is severe or gets worse throughout the day.  Gastritis  Gastritis is an inflammation (the body's way of reacting to injury and/or infection) of the stomach. It is often caused by viral or bacterial (germ) infections. It can also be caused BY ASPIRIN, BC/GOODY POWDER'S, (IBUPROFEN) MOTRIN, OR ALEVE (NAPROXEN), chemicals (including alcohol), SPICY FOODS, and medications. This illness may be associated with generalized malaise (feeling tired, not well), UPPER ABDOMINAL STOMACH cramps, and fever. One common bacterial cause of gastritis is an organism known as H. Pylori. This can be treated with antibiotics.   ESOPHAGEAL STRICTURE  Esophageal strictures can be caused by  stomach acid backing up into the tube that carries food from the mouth down to the stomach (lower esophagus).  TREATMENT There are a number of  medicines used to treat reflux/stricture, including: Antacids.  Proton-pump inhibitors: PROTONIX PEPCID OR ZANTAC   Lifestyle and home remedies TO CONTROL REFLUX/HEARTBURN You may eliminate or reduce the frequency of heartburn by making the following lifestyle changes:   Control your weight. Being overweight is a major risk factor for heartburn and GERD. Excess pounds put pressure on your abdomen, pushing up your stomach and causing acid to back up into your esophagus.    Eat smaller meals. 4 TO 6 MEALS A DAY. This reduces pressure on the lower esophageal sphincter, helping to prevent the valve from opening and acid from washing back into your esophagus.    Loosen your belt. Clothes that fit tightly around your waist put pressure on your abdomen and the lower esophageal sphincter.    Eliminate heartburn triggers. Everyone has specific triggers. Common triggers such as fatty or fried foods, spicy food, tomato sauce, carbonated beverages, alcohol, chocolate, mint, garlic, onion, caffeine and nicotine may make heartburn worse.    Avoid stooping or bending. Tying your shoes is OK. Bending over for longer periods to weed your garden isn't, especially soon after eating.    Don't lie down after a meal. Wait at least three to four hours after eating before going to bed, and don't lie down right after eating.    Alternative medicine  Several home remedies exist for treating GERD, but they provide only temporary relief. They include drinking baking soda (sodium bicarbonate) added to water or drinking other fluids such as baking soda mixed with cream of tartar and water.   Although these liquids create temporary relief by neutralizing, washing away or buffering acids, eventually they aggravate the situation by adding gas and fluid to your stomach,  increasing pressure and causing more acid reflux. Further, adding more sodium to your diet may increase your blood pressure and add stress to your heart, and excessive bicarbonate ingestion can alter the acid-base balance in your body.   Low-Fat Diet  BREADS, CEREALS, PASTA, RICE, DRIED PEAS, AND BEANS  These products are high in carbohydrates and most are low in fat. Therefore, they can be increased in the diet as substitutes for fatty foods. They too, however, contain calories and should not be eaten in excess. Cereals can be eaten for snacks as well as for breakfast.   Include foods that contain fiber (fruits, vegetables, whole grains, and legumes). Research shows that fiber may lower blood cholesterol levels, especially the water-soluble fiber found in fruits, vegetables, oat products, and legumes.  FRUITS AND VEGETABLES  It is good to eat fruits and vegetables. Besides being sources of fiber, both are rich in vitamins and some minerals. They help you get the daily allowances of these nutrients. Fruits and vegetables can be used for snacks and desserts.  MEATS  Limit lean meat, chicken, [place], and fish to no more than 6 ounces per day.  Beef, Pork, and Lamb  Use lean cuts of beef, pork, and lamb. Lean cuts include:   Extra-lean ground beef.   Arm roast.   Sirloin tip.   Center-cut ham.   Round steak.   Loin chops.   Rump roast.   Tenderloin.   Trim all fat off the outside of meats before cooking. It is not necessary to severely decrease the intake of red meat, but lean choices should be made. Lean meat is rich in protein and contains a highly absorbable form of iron. Premenopausal women, in particular, should avoid reducing lean red meat because this could increase the risk for low red blood cells (iron-deficiency anemia).  Processed Meats  Processed meats, such as bacon, bologna, salami, sausage, and hot dogs contain large quantities of fat, are not rich in valuable  nutrients, and should not be eaten very often.  Organ Meats  The organ meats, such as liver, sweetbreads, kidneys, and brain are very rich in cholesterol. They should be limited.  Chicken and [place]  These are good sources of protein. The fat of poultry can be reduced by removing the skin and underlying fat layers before cooking. Chicken and [place] can be substituted for lean red meat in the diet. Poultry should not be fried or covered with high-fat sauces.  Fish and Shellfish  Fish is a good source of protein. Shellfish contain cholesterol, but they usually are low in saturated fatty acids. The preparation of fish is important. Like chicken and [place], they should not be fried or covered with high-fat sauces.  EGGS  Egg yolks often are hidden in cooked and processed foods. Egg whites contain no fat or cholesterol. They can be eaten often. Try 1 to 2 egg whites instead of whole eggs in recipes or use egg substitutes that do not contain yolk.  MILK AND DAIRY PRODUCTS  Use skim or 1% milk instead of 2% or whole milk. Decrease whole milk, natural, and processed cheeses. Use nonfat or low-fat (2%) cottage cheese or low-fat cheeses made from vegetable oils. Choose nonfat or low-fat (1 to 2%) yogurt. Experiment with evaporated skim milk in recipes that call for heavy cream. Substitute low-fat yogurt or low-fat cottage cheese for sour cream in dips and salad dressings. Have at least 2 servings of low-fat dairy products, such as 2 glasses of skim (or 1%) milk each day to help get your daily calcium intake.  FATS AND OILS  Reduce the total intake of fats, especially saturated fat. Butterfat, lard, and beef fats are high in saturated fat and cholesterol. These should be avoided as much as possible. Vegetable fats do not contain cholesterol, but certain vegetable fats, such as coconut oil, palm oil, and palm kernel oil are very high in saturated fats. These should be limited. These fats are often used  in bakery goods, processed foods, popcorn, oils, and nondairy creamers. Vegetable shortenings and some peanut butters contain hydrogenated oils, which are also saturated fats. Read the labels on these foods and check for saturated vegetable oils.  Unsaturated vegetable oils and fats do not raise blood cholesterol. However, they should be limited because they are fats and are high in calories. Total fat should still be limited to 30% of your daily caloric intake. Desirable liquid vegetable oils are corn oil, cottonseed oil, olive oil, canola oil, safflower oil, soybean oil, and sunflower oil. Peanut oil is not as good, but  small amounts are acceptable. Buy a heart-healthy tub margarine that has no partially hydrogenated oils in the ingredients. Mayonnaise and salad dressings often are made from unsaturated fats, but they should also be limited because of their high calorie and fat content.  Seeds, nuts, peanut butter, olives, and avocados are high in fat, but the fat is mainly the unsaturated type. These foods should be limited mainly to avoid excess calories and fat.  OTHER EATING TIPS  Snacks   Most sweets should be limited as snacks. They tend to be rich in calories and fats, and their caloric content outweighs their nutritional value. Some good choices in snacks are graham crackers, melba toast, soda crackers, bagels (no egg), English muffins, fruits, and vegetables. These snacks are preferable to snack crackers, [place] fries, and chips. Popcorn should be air-popped or cooked in small amounts of liquid vegetable oil.  Desserts  Eat fruit, low-fat yogurt, and fruit ices instead of pastries, cake, and cookies. Sherbet, angel food cake, gelatin dessert, frozen low-fat yogurt, or other frozen products that do not contain saturated fat (pure fruit juice bars, frozen ice pops) are also acceptable.   COOKING METHODS  Choose those methods that use little or no fat. They include:  Poaching.    Braising.   Steaming.   Grilling.   Baking.   Stir-frying.   Broiling.   Microwaving.   Foods can be cooked in a nonstick pan without added fat, or use a nonfat cooking spray in regular cookware. Limit fried foods and avoid frying in saturated fat. Add moisture to lean meats by using water, broth, cooking wines, and other nonfat or low-fat sauces along with the cooking methods mentioned above.  Soups and stews should be chilled after cooking. The fat that forms on top after a few hours in the refrigerator should be skimmed off. When preparing meals, avoid using excess salt. Salt can contribute to raising blood pressure in some people.  EATING AWAY FROM HOME  Order entres, potatoes, and vegetables without sauces or butter. When meat exceeds the size of a deck of cards (3 to 4 ounces), the rest can be taken home for another meal.  Choose vegetable or fruit salads and ask for low-calorie salad dressings to be served on the side. Use dressings sparingly. Limit high-fat toppings, such as bacon, crumbled eggs, cheese, sunflower seeds, and olives. Ask for heart-healthy tub margarine instead of butter.

## 2017-09-03 ENCOUNTER — Encounter: Payer: Self-pay | Admitting: Gastroenterology

## 2017-09-04 ENCOUNTER — Encounter (HOSPITAL_COMMUNITY): Payer: Self-pay | Admitting: Gastroenterology

## 2017-09-05 ENCOUNTER — Telehealth: Payer: Self-pay | Admitting: Gastroenterology

## 2017-09-05 NOTE — Telephone Encounter (Signed)
Please call pt. His stomach Bx shows mild gastritis.    DRINK WATER TO KEEP YOUR URINE LIGHT YELLOW.  AVOID REFLUX TRIGGERS.   STRICTLY FOLLOW A LOW FAT DIET.   CONTINUE YOUR WEIGHT LOSS EFFORTS.   A WEIGHT OF 170 LBS OR LESS WILL KEEP YOUR BODY MASS INDEX(BMI) UNDER 30. IF YOU FEEL YOU NEED A REFERRAL FOR WEIGHT LOSS SURGERY PLEASE LET ME KNOW.   STOP CARAFATE.  CONTINUE PREVACID. INCREASE TO 30 MINUTES PRIOR TO BREAKFAST AND SUPPER.  PLEASE CALL IN ONE MONTH IF YOUR SWALLOWING IS NOT IMPROVED.    FOLLOW UP IN 4 MOS E30 DYSPHAGIA/GERD.

## 2017-09-05 NOTE — Telephone Encounter (Signed)
PT is aware.

## 2017-09-05 NOTE — Telephone Encounter (Signed)
Patient scheduled.

## 2017-09-05 NOTE — Telephone Encounter (Signed)
LMOM to call.

## 2017-09-26 DIAGNOSIS — G4733 Obstructive sleep apnea (adult) (pediatric): Secondary | ICD-10-CM | POA: Diagnosis not present

## 2017-10-26 ENCOUNTER — Other Ambulatory Visit: Payer: Self-pay | Admitting: Gastroenterology

## 2017-10-26 DIAGNOSIS — G4733 Obstructive sleep apnea (adult) (pediatric): Secondary | ICD-10-CM | POA: Diagnosis not present

## 2017-11-20 ENCOUNTER — Telehealth: Payer: Self-pay

## 2017-11-20 NOTE — Telephone Encounter (Signed)
I have started a PA for the Ranitidine.

## 2017-11-25 DIAGNOSIS — I10 Essential (primary) hypertension: Secondary | ICD-10-CM | POA: Diagnosis not present

## 2017-11-25 DIAGNOSIS — F5101 Primary insomnia: Secondary | ICD-10-CM | POA: Diagnosis not present

## 2017-11-25 DIAGNOSIS — Z23 Encounter for immunization: Secondary | ICD-10-CM | POA: Diagnosis not present

## 2017-11-25 DIAGNOSIS — K219 Gastro-esophageal reflux disease without esophagitis: Secondary | ICD-10-CM | POA: Diagnosis not present

## 2017-11-25 DIAGNOSIS — J301 Allergic rhinitis due to pollen: Secondary | ICD-10-CM | POA: Diagnosis not present

## 2017-11-26 DIAGNOSIS — G4733 Obstructive sleep apnea (adult) (pediatric): Secondary | ICD-10-CM | POA: Diagnosis not present

## 2017-12-02 NOTE — Telephone Encounter (Signed)
LMOM for a return call. ( Is pt taking the Prevacid also?)

## 2017-12-12 NOTE — Telephone Encounter (Signed)
LMOM to call.

## 2017-12-26 DIAGNOSIS — G4733 Obstructive sleep apnea (adult) (pediatric): Secondary | ICD-10-CM | POA: Diagnosis not present

## 2018-01-17 DIAGNOSIS — G4733 Obstructive sleep apnea (adult) (pediatric): Secondary | ICD-10-CM | POA: Diagnosis not present

## 2018-01-22 ENCOUNTER — Telehealth: Payer: Self-pay | Admitting: Gastroenterology

## 2018-01-22 ENCOUNTER — Encounter: Payer: Self-pay | Admitting: Gastroenterology

## 2018-01-22 ENCOUNTER — Ambulatory Visit: Payer: BLUE CROSS/BLUE SHIELD | Admitting: Gastroenterology

## 2018-01-22 NOTE — Telephone Encounter (Signed)
PATIENT WAS A NO SHOW AND LETTER SENT  °

## 2018-01-26 DIAGNOSIS — G4733 Obstructive sleep apnea (adult) (pediatric): Secondary | ICD-10-CM | POA: Diagnosis not present

## 2018-02-26 DIAGNOSIS — G4733 Obstructive sleep apnea (adult) (pediatric): Secondary | ICD-10-CM | POA: Diagnosis not present

## 2018-03-28 DIAGNOSIS — G4733 Obstructive sleep apnea (adult) (pediatric): Secondary | ICD-10-CM | POA: Diagnosis not present

## 2018-04-09 DIAGNOSIS — Z23 Encounter for immunization: Secondary | ICD-10-CM | POA: Diagnosis not present

## 2018-04-28 DIAGNOSIS — G4733 Obstructive sleep apnea (adult) (pediatric): Secondary | ICD-10-CM | POA: Diagnosis not present

## 2018-06-24 DIAGNOSIS — Z13220 Encounter for screening for lipoid disorders: Secondary | ICD-10-CM | POA: Diagnosis not present

## 2018-06-24 DIAGNOSIS — Z0001 Encounter for general adult medical examination with abnormal findings: Secondary | ICD-10-CM | POA: Diagnosis not present

## 2018-06-24 DIAGNOSIS — E7841 Elevated Lipoprotein(a): Secondary | ICD-10-CM | POA: Diagnosis not present

## 2018-06-27 DIAGNOSIS — Z1212 Encounter for screening for malignant neoplasm of rectum: Secondary | ICD-10-CM | POA: Diagnosis not present

## 2018-06-27 DIAGNOSIS — J301 Allergic rhinitis due to pollen: Secondary | ICD-10-CM | POA: Diagnosis not present

## 2018-06-27 DIAGNOSIS — F5101 Primary insomnia: Secondary | ICD-10-CM | POA: Diagnosis not present

## 2018-06-27 DIAGNOSIS — I10 Essential (primary) hypertension: Secondary | ICD-10-CM | POA: Diagnosis not present

## 2018-06-27 DIAGNOSIS — K219 Gastro-esophageal reflux disease without esophagitis: Secondary | ICD-10-CM | POA: Diagnosis not present

## 2018-06-27 DIAGNOSIS — Z0001 Encounter for general adult medical examination with abnormal findings: Secondary | ICD-10-CM | POA: Diagnosis not present

## 2018-10-29 DIAGNOSIS — T1512XA Foreign body in conjunctival sac, left eye, initial encounter: Secondary | ICD-10-CM | POA: Diagnosis not present

## 2018-12-22 DIAGNOSIS — I10 Essential (primary) hypertension: Secondary | ICD-10-CM | POA: Diagnosis not present

## 2018-12-22 DIAGNOSIS — K219 Gastro-esophageal reflux disease without esophagitis: Secondary | ICD-10-CM | POA: Diagnosis not present

## 2018-12-24 DIAGNOSIS — I10 Essential (primary) hypertension: Secondary | ICD-10-CM | POA: Diagnosis not present

## 2018-12-24 DIAGNOSIS — J301 Allergic rhinitis due to pollen: Secondary | ICD-10-CM | POA: Diagnosis not present

## 2018-12-24 DIAGNOSIS — F5101 Primary insomnia: Secondary | ICD-10-CM | POA: Diagnosis not present

## 2018-12-24 DIAGNOSIS — G252 Other specified forms of tremor: Secondary | ICD-10-CM | POA: Diagnosis not present

## 2018-12-24 DIAGNOSIS — K219 Gastro-esophageal reflux disease without esophagitis: Secondary | ICD-10-CM | POA: Diagnosis not present

## 2018-12-26 ENCOUNTER — Other Ambulatory Visit: Payer: Self-pay

## 2018-12-26 DIAGNOSIS — Z20822 Contact with and (suspected) exposure to covid-19: Secondary | ICD-10-CM

## 2018-12-29 LAB — NOVEL CORONAVIRUS, NAA: SARS-CoV-2, NAA: NOT DETECTED

## 2019-07-02 DIAGNOSIS — Z0001 Encounter for general adult medical examination with abnormal findings: Secondary | ICD-10-CM | POA: Diagnosis not present

## 2019-07-08 DIAGNOSIS — Z0001 Encounter for general adult medical examination with abnormal findings: Secondary | ICD-10-CM | POA: Diagnosis not present

## 2019-07-08 DIAGNOSIS — Z1212 Encounter for screening for malignant neoplasm of rectum: Secondary | ICD-10-CM | POA: Diagnosis not present

## 2019-07-08 DIAGNOSIS — F5101 Primary insomnia: Secondary | ICD-10-CM | POA: Diagnosis not present

## 2019-07-08 DIAGNOSIS — J301 Allergic rhinitis due to pollen: Secondary | ICD-10-CM | POA: Diagnosis not present

## 2019-07-08 DIAGNOSIS — I10 Essential (primary) hypertension: Secondary | ICD-10-CM | POA: Diagnosis not present

## 2019-07-08 DIAGNOSIS — R4582 Worries: Secondary | ICD-10-CM | POA: Diagnosis not present

## 2019-07-08 DIAGNOSIS — K219 Gastro-esophageal reflux disease without esophagitis: Secondary | ICD-10-CM | POA: Diagnosis not present

## 2020-01-04 DIAGNOSIS — I10 Essential (primary) hypertension: Secondary | ICD-10-CM | POA: Diagnosis not present

## 2020-01-04 DIAGNOSIS — F17200 Nicotine dependence, unspecified, uncomplicated: Secondary | ICD-10-CM | POA: Diagnosis not present

## 2020-01-04 DIAGNOSIS — K219 Gastro-esophageal reflux disease without esophagitis: Secondary | ICD-10-CM | POA: Diagnosis not present

## 2020-01-04 DIAGNOSIS — Z13220 Encounter for screening for lipoid disorders: Secondary | ICD-10-CM | POA: Diagnosis not present

## 2020-01-08 DIAGNOSIS — R4582 Worries: Secondary | ICD-10-CM | POA: Diagnosis not present

## 2020-01-08 DIAGNOSIS — K219 Gastro-esophageal reflux disease without esophagitis: Secondary | ICD-10-CM | POA: Diagnosis not present

## 2020-01-08 DIAGNOSIS — J301 Allergic rhinitis due to pollen: Secondary | ICD-10-CM | POA: Diagnosis not present

## 2020-01-08 DIAGNOSIS — I10 Essential (primary) hypertension: Secondary | ICD-10-CM | POA: Diagnosis not present

## 2020-01-08 DIAGNOSIS — F5101 Primary insomnia: Secondary | ICD-10-CM | POA: Diagnosis not present
